# Patient Record
Sex: Male | Born: 1950
Health system: Southern US, Community
[De-identification: ages and names within clinical notes are randomized; demographics above are authoritative.]

## PROBLEM LIST (undated history)

## (undated) DIAGNOSIS — G4733 Obstructive sleep apnea (adult) (pediatric): Secondary | ICD-10-CM

## (undated) DIAGNOSIS — K219 Gastro-esophageal reflux disease without esophagitis: Secondary | ICD-10-CM

## (undated) DIAGNOSIS — J309 Allergic rhinitis, unspecified: Secondary | ICD-10-CM

## (undated) DIAGNOSIS — J45909 Unspecified asthma, uncomplicated: Secondary | ICD-10-CM

## (undated) HISTORY — PX: OTHER SURGICAL HISTORY: SHX169

## (undated) HISTORY — DX: Obstructive sleep apnea (adult) (pediatric): G47.33

## (undated) HISTORY — PX: HEMORRHOID SURGERY: SHX153

## (undated) HISTORY — PX: NASAL SEPTOPLASTY W/ TURBINOPLASTY: SHX2070

## (undated) HISTORY — DX: Allergic rhinitis, unspecified: J30.9

## (undated) HISTORY — DX: Unspecified asthma, uncomplicated: J45.909

## (undated) HISTORY — DX: Gastro-esophageal reflux disease without esophagitis: K21.9

---

## 1998-03-02 ENCOUNTER — Encounter (HOSPITAL_COMMUNITY): Admission: RE | Admit: 1998-03-02 | Discharge: 1998-05-31 | Payer: Self-pay | Admitting: *Deleted

## 1998-03-07 ENCOUNTER — Ambulatory Visit (HOSPITAL_COMMUNITY): Admission: RE | Admit: 1998-03-07 | Discharge: 1998-03-07 | Payer: Self-pay | Admitting: *Deleted

## 1998-03-10 ENCOUNTER — Encounter: Payer: Self-pay | Admitting: *Deleted

## 1998-03-10 ENCOUNTER — Ambulatory Visit (HOSPITAL_COMMUNITY): Admission: RE | Admit: 1998-03-10 | Discharge: 1998-03-10 | Payer: Self-pay | Admitting: *Deleted

## 1998-04-18 ENCOUNTER — Ambulatory Visit (HOSPITAL_BASED_OUTPATIENT_CLINIC_OR_DEPARTMENT_OTHER): Admission: RE | Admit: 1998-04-18 | Discharge: 1998-04-18 | Payer: Self-pay | Admitting: General Surgery

## 1999-04-10 ENCOUNTER — Encounter: Payer: Self-pay | Admitting: Internal Medicine

## 1999-04-10 ENCOUNTER — Ambulatory Visit: Admission: RE | Admit: 1999-04-10 | Discharge: 1999-04-10 | Payer: Self-pay | Admitting: Internal Medicine

## 2004-04-27 ENCOUNTER — Encounter (INDEPENDENT_AMBULATORY_CARE_PROVIDER_SITE_OTHER): Payer: Self-pay | Admitting: *Deleted

## 2004-04-27 ENCOUNTER — Ambulatory Visit (HOSPITAL_COMMUNITY): Admission: RE | Admit: 2004-04-27 | Discharge: 2004-04-27 | Payer: Self-pay | Admitting: *Deleted

## 2004-08-15 ENCOUNTER — Ambulatory Visit: Payer: Self-pay | Admitting: Internal Medicine

## 2005-07-05 ENCOUNTER — Ambulatory Visit: Payer: Self-pay | Admitting: Internal Medicine

## 2006-07-05 ENCOUNTER — Ambulatory Visit: Payer: Self-pay | Admitting: Internal Medicine

## 2006-12-26 ENCOUNTER — Encounter: Admission: RE | Admit: 2006-12-26 | Discharge: 2006-12-26 | Payer: Self-pay | Admitting: Internal Medicine

## 2007-07-03 DIAGNOSIS — J3089 Other allergic rhinitis: Secondary | ICD-10-CM

## 2007-07-03 DIAGNOSIS — J4520 Mild intermittent asthma, uncomplicated: Secondary | ICD-10-CM | POA: Insufficient documentation

## 2007-07-03 DIAGNOSIS — G4733 Obstructive sleep apnea (adult) (pediatric): Secondary | ICD-10-CM | POA: Insufficient documentation

## 2007-07-03 DIAGNOSIS — J302 Other seasonal allergic rhinitis: Secondary | ICD-10-CM | POA: Insufficient documentation

## 2007-07-04 ENCOUNTER — Ambulatory Visit: Payer: Self-pay | Admitting: Internal Medicine

## 2007-07-05 DIAGNOSIS — K219 Gastro-esophageal reflux disease without esophagitis: Secondary | ICD-10-CM | POA: Insufficient documentation

## 2007-07-22 ENCOUNTER — Telehealth (INDEPENDENT_AMBULATORY_CARE_PROVIDER_SITE_OTHER): Payer: Self-pay | Admitting: *Deleted

## 2007-10-03 ENCOUNTER — Ambulatory Visit: Payer: Self-pay | Admitting: Internal Medicine

## 2007-10-03 DIAGNOSIS — R232 Flushing: Secondary | ICD-10-CM | POA: Insufficient documentation

## 2007-10-06 ENCOUNTER — Telehealth: Payer: Self-pay | Admitting: Internal Medicine

## 2007-10-27 ENCOUNTER — Encounter: Payer: Self-pay | Admitting: Internal Medicine

## 2008-03-19 ENCOUNTER — Observation Stay (HOSPITAL_COMMUNITY): Admission: EM | Admit: 2008-03-19 | Discharge: 2008-03-21 | Payer: Self-pay | Admitting: Emergency Medicine

## 2008-06-28 ENCOUNTER — Ambulatory Visit: Payer: Self-pay | Admitting: Internal Medicine

## 2009-01-25 ENCOUNTER — Encounter: Payer: Self-pay | Admitting: Internal Medicine

## 2009-06-28 ENCOUNTER — Ambulatory Visit: Payer: Self-pay | Admitting: Internal Medicine

## 2010-06-14 ENCOUNTER — Encounter: Payer: Self-pay | Admitting: Internal Medicine

## 2010-06-22 NOTE — Assessment & Plan Note (Signed)
Summary: 12 MOS/VAC  Medications Added * HCTZ 25MG  Take 1 tablet by mouth once a day ZITHROMAX Z-PAK 250 MG  TABS (AZITHROMYCIN) 2 today then 1 daily      Allergies Added: NKDA  Visit Type:  Follow-up PCP:  Todd Gentry  Chief Complaint:  yearly check up.  History of Present Illness: Current Problems:  ESOPHAGEAL REFLUX (ICD-530.81) ASTHMA (ICD-493.90) ALLERGIC RHINITIS (ICD-477.9) OBSTRUCTIVE SLEEP APNEA (ICD-327.23)  Lingering nasal drainage after a cold a month ago. Not purulent now, and no fever or headache. Ears are ok. Still using CPAP 16 ok Gentiva, heated humidifier, nasal mask Not a lot of heart burn currently- did bother earlier. He is monitored for Barrett's esophagus. We discussed the concern that some of his throat sensation could be from bland reflux.      Current Allergies (reviewed today): No known allergies   Past Medical History:    Reviewed history and no changes required:       OSA       Allergic rhinitis       Asthma       G E R D       CXR nodule chronic 7-8 R intercostal space posteriorly  Past Surgical History:    Reviewed history and no changes required:       Hemorrhoidectomy       nasal septoplasty   Family History:    Reviewed history and no changes required:       Father had long hospital stay with sepsis- died,   Parkinsons       Mother has allergic rhinitis  Social History:    Reviewed history and no changes required:       Patient states former smoker.    Risk Factors:  Tobacco use:  quit   Review of Systems      See HPI   Vital Signs:  Patient Profile:   60 Years Old Male Weight:      224.38 pounds O2 Sat:      96 % O2 treatment:    Room Air Pulse rate:   83 / minute BP sitting:   122 / 88  (right arm)  Vitals Entered By: Todd Gentry (July 04, 2007 8:52 AM)             Is Patient Diabetic? No Comments Medications reviewed with  patient ..................................................................Marland KitchenDarra Lis Gentry  July 04, 2007 8:58 AM      Physical Exam  General:     well developed, well nourished, in no acute distress Head:     normocephalic and atraumatic Eyes:     PERRLA/EOM intact; conjunctiva and sclera clear Ears:     TMs intact and clear with normal canals Nose:     no deformity, discharge, inflammation, or lesions Mouth:     Melampatti Class III.   Mucosa not red or cobblestoned Neck:     no masses, thyromegaly, or abnormal cervical nodes Chest Wall:     no deformities noted Lungs:     clear bilaterally to auscultation and percussion Heart:     regular rate and rhythm, S1, S2 without murmurs, rubs, gallops, or clicks Cervical Nodes:     no significant adenopathy     Impression & Recommendations:  Problem # 1:  OBSTRUCTIVE SLEEP APNEA (ICD-327.23) Assessment: Unchanged Continue cpap16 Orders: Est. Patient Level III (57846)   Problem # 2:  ALLERGIC RHINITIS (ICD-477.9) Contolled seasonal rhinitis. Slowly resolving postviral type rhinitis should finish resolving soon. We  will send in Rx for z-pak to hold, with discussion.  Orders: Est. Patient Level III ([id number])   Medications Added to Medication List This Visit: 1)  Hctz 25mg   .... Take 1 tablet by mouth once a day 2)  Zithromax Z-pak 250 Mg Tabs (Azithromycin) .... 2 today then 1 daily   Patient Instructions: 1)  Please schedule a follow-up appointment in 1 year. 2)  Z pak script at drug store    Prescriptions: ZITHROMAX Z-PAK 250 MG  TABS (AZITHROMYCIN) 2 today then 1 daily  #1 pak x 0   Entered and Authorized by:   [name] MD   Signed by:   [name] MD on 07/05/2007   Method used:   Electronically sent to ...       [facility]  [phone]*       [address]       [place], [place]  [address]       Ph: [phone] or [phone]       Fax: [phone]   RxID:    [id number]  ]

## 2010-06-22 NOTE — Assessment & Plan Note (Signed)
Summary: 12 months/apc   Primary Provider/Referring Provider:  Tyson Dense  CC:  Yearly follow up visit-no complaints;[facility]  History of Present Illness: 07/04/07- Lingering nasal drainage after a cold a month ago. Not purulent now, and no fever or headache. Ears are ok. Still using CPAP 16 ok Gentiva, heated humidifier, nasal mask Not a lot of heart burn currently- did bother earlier. He is monitored for Barrett's esophagus. We discussed the concern that some of his throat sensation could be from bland reflux.  10/03/07 60 year old male pt that presents for 6 weeks of facial redness that is predominatley across bilateral cheeks/nose. Feels warm at times, has episodes when it get very red. Does not have a known reproducible triggers. Denies new meds, excessive sun exposure, recent antibiotic but occured before taking med, no new meds. no joint aches, dysphagia, fever, blisters or acne. Minimal etoh intake.  Wears CPAP mask at night-had mask for 8 yrs.   06/28/08- Rhinitis, OSA, asthma Flushing resolved itself last Spring. Breathing has done very well. stopped advair last year- it made him anxious, and restless. Has had almost no need for rescue inhaler. Walking has lost 20 lbs. Still wears CPAP 16 every night- good compliance and control.   June 28, 2009- Rhinitis, OSA, asthma Has regained about 6 lbs. Has rarely needed his rescue inhaler over the past year, stil using only his sample.  He continues CPAP 16 used every night with no daytime sleepiness and no problems.     Current Medications (verified): 1)  Prilosec 20 Mg  Cpdr (Omeprazole) .... Take 1 Capsule By Mouth Two Times A Day 2)  Cpap .Marland KitchenMarland Kitchen. 16 3)  Hydrochlorothiazide 25 Mg  Tabs (Hydrochlorothiazide) .... Take 1 Tablet By Mouth Once A Day 4)  Proair Hfa 108 (90 Base) Mcg/act  Aers (Albuterol Sulfate) .... 2 Puffs Four Times A Day As Needed  Allergies (verified): No Known Drug  Allergies  Past History:  Past Medical History: Last updated: 10/03/2007 G E R D (ICD-530.81) ESOPHAGEAL REFLUX (ICD-530.81) ASTHMA (ICD-493.90) ALLERGIC RHINITIS (ICD-477.9) OBSTRUCTIVE SLEEP APNEA (ICD-327.23)    Past Surgical History: Last updated: 06/28/2008 Hemorrhoidectomy nasal septoplasty limited palatoplasty/uvulectomy  Family History: Last updated: 07/04/2007 Father had long hospital stay with sepsis- died,   Parkinsons Mother has allergic rhinitis  Social History: Last updated: 06/28/2008 Patient states former smoker.  married  Risk Factors: Smoking Status: quit (07/04/2007)  Review of Systems      See HPI  The patient denies anorexia, fever, weight loss, weight gain, vision loss, decreased hearing, hoarseness, chest pain, syncope, dyspnea on exertion, peripheral edema, prolonged cough, headaches, hemoptysis, abdominal pain, and severe indigestion/heartburn.    Vital Signs:  Patient profile:   60 year old male Height:      72 inches Weight:      223.13 pounds BMI:     30.37 O2 Sat:      98 % on Room air Pulse rate:   83 / minute BP sitting:   126 / 84  (left arm) Cuff size:   regular  Vitals Entered By: Reynaldo Minium CMA (June 28, 2009 9:05 AM)  O2 Flow:  Room air  Physical Exam  Additional Exam:  General: A/Ox3; pleasant and cooperative, NAD, still overweight SKIN: no rash, lesions NODES: no lymphadenopathy HEENT: [place]/AT, EOM- WNL, Conjuctivae- clear, PERRLA, TM-WNL, Nose- clear, Throat- clear and wnl, Melampatti III with shortened uvula NECK: Supple w/ fair ROM, JVD- none, normal carotid  impulses w/o bruits Thyroid-  CHEST: Clear to P&A HEART: RRR, no m/g/r heard ABDOMEN: Soft and nl;  XBJ:YNWG, nl pulses, no edema  NEURO: Grossly intact to observation      Impression & Recommendations:  Problem # 1:  ASTHMA (ICD-493.90) He is well controlled now and rare use of a rescue inhaler has been sufficient. We will see how he does as  Spring comes in, occasionally a problem season for him.  Problem # 2:  OBSTRUCTIVE SLEEP APNEA (ICD-327.23)  Good CPAP compliance and control. I encouraged ongoing attention to his weight.  Orders: Est. Patient Level III (95621)  Patient Instructions: 1)  Schedule return in one year, earlier if needed 2)  Continue CPAP at 16 3)  Sample rescue inhaler

## 2010-06-22 NOTE — Progress Notes (Signed)
Summary: LMTCB-need order for mask  Phone Note Call from Patient Call back at [phone]   Caller: Patient Call For: young Summary of Call: need order sent to [facility] to get mask  Initial call taken by: Rickard Patience,  Oct 06, 2007 9:13 AM  Follow-up for Phone Call        Attempted to call pt back.  LMOM. Cloyde Reams RN  Oct 06, 2007 10:09 AM   Additional Follow-up for Phone Call Additional follow up Details #1::        MASK HAS TEAR IN IT AND IS JUST OLD, HAS HAD IT FOR 7-8 [facility] AND NEEDS NEW ONE   Philipp Deputy [facility]  Oct 06, 2007 11:06 AM  Additional Follow-up by: [name] MD,  Oct 06, 2007 11:47 AM    Additional Follow-up for Phone Call Additional follow up Details #2::    Called pt and he stated that he drove to [place] and they didn't have mask in store that they would mail him one from the Warehouse. Pt stated [facility] told him it would be a day or so. I advised pt that I would follow up with [facility] in the morning to check on status of order. Office closed now. Rhonda Cobb  Oct 06, 2007 5:18 PM Spoke with Christoper Allegra this afternoon. Stated that mask was not in stock, shipped from warehouse, pt shoud recvd. mask today or no later than tomorrow (10/08/07).  LMOAM for pt at work to call me if he hasn't recvd. mask by 10/08/07 and I would call [facility] back. Follow-up by: Alfonso Ramus,  Oct 07, 2007 2:54 PM

## 2010-06-22 NOTE — Consult Note (Signed)
Summary: [facility]  [facility]   Imported By: Lanelle Bal 11/05/2007 15:15:01  _____________________________________________________________________  External Attachment:    Type:   Image     Comment:   External Document

## 2010-06-22 NOTE — Assessment & Plan Note (Signed)
Summary: face red/ mbw   PCP:  Tyson Dense  Chief Complaint:  redness on face - comes and goes x2-3 weeks.  History of Present Illness: Current Problems:  ESOPHAGEAL REFLUX (ICD-530.81) ASTHMA (ICD-493.90) ALLERGIC RHINITIS (ICD-477.9) OBSTRUCTIVE SLEEP APNEA (ICD-327.23)  Still using CPAP 16 ok Gentiva, heated humidifier, nasal mask    60 year old male pt that presents for 6 weeks of facial redness that is predominatley across bilateral cheeks/nose. Feels warm at times, has episodes when it get very red. Does not have a known reproducible triggers. Denies new meds, excessive sun exposure, recent antibiotic but occured before taking med, no new meds. no joint aches, dysphagia, fever, blisters or acne. Minimal etoh intake.  Wears CPAP mask at night-had mask for 8 yrs.      Prior Medication List:  NEXIUM 40 MG  CPDR (ESOMEPRAZOLE MAGNESIUM) Take 1 tablet by mouth once a day * CPAP 16 * HCTZ 25MG  Take 1 tablet by mouth once a day ZITHROMAX Z-PAK 250 MG  TABS (AZITHROMYCIN) 2 today then 1 daily PULMICORT FLEXHALER 90 MCG/ACT  INHA (BUDESONIDE) 2 puffs and rinse, twice every day PROAIR HFA 108 (90 BASE) MCG/ACT  AERS (ALBUTEROL SULFATE) 2 puffs four times a day as needed   Updated Prior Medication List: PRILOSEC 20 MG  CPDR (OMEPRAZOLE) Take 1 capsule by mouth two times a day * CPAP 16 HYDROCHLOROTHIAZIDE 25 MG  TABS (HYDROCHLOROTHIAZIDE) Take 1 tablet by mouth once a day PROAIR HFA 108 (90 BASE) MCG/ACT  AERS (ALBUTEROL SULFATE) 2 puffs four times a day as needed  Current Allergies (reviewed today): No known allergies   Past Medical History:    Reviewed history from 07/04/2007 and no changes required:       G E R D (ICD-530.81)       ESOPHAGEAL REFLUX (ICD-530.81)       ASTHMA (ICD-493.90)       ALLERGIC RHINITIS (ICD-477.9)       OBSTRUCTIVE SLEEP APNEA (ICD-327.23)           Family History:    Reviewed history from 07/04/2007 and no changes required:       Father  had long hospital stay with sepsis- died,   Parkinsons       Mother has allergic rhinitis  Social History:    Reviewed history from 07/04/2007 and no changes required:       Patient states former smoker.    Risk Factors: Tobacco use:  quit    Year quit:  1984    Pack-years:  84yrs, 1ppd   Review of Systems      See HPI   Vital Signs:  Patient Profile:   60 Years Old Male Weight:      226.38 pounds O2 Sat:      97 % O2 treatment:    Room Air Temp:     97.5 degrees F oral Pulse rate:   79 / minute BP sitting:   116 / 64  (left arm) Cuff size:   regular  Vitals Entered By: Boone Master CNA (Oct 03, 2007 9:58 AM)             Is Patient Diabetic? No Comments Medications reviewed with patient Boone Master CNA  Oct 03, 2007 9:58 AM      Physical Exam  GENERAL:  A/Ox3; pleasant & cooperative.NAD HEENT:  [place]/AT, EOM-wnl, PERRLA, EACs-clear, TMs-wnl, NOSE-clear, THROAT-clear & wnl. NECK:  Supple w/ fair ROM; no JVD; normal carotid impulses w/o bruits; no thyromegaly  or nodules palpated; no lymphadenopathy. CHEST:  Clear to P & A; w/o, wheezes/ rales/ or rhonchi. HEART:  RRR, no m/r/g  heard ABDOMEN:  Soft & nt; nml bowel sounds; no organomegaly or masses detected. EXT: Warm bilat,  no calf pain, edema, clubbing, pulses intact Skin: facial with reddened skin along cheeks, no vesicles/papules noted, no abnormal blood vessels.      Impression & Recommendations:  Problem # 1:  FACIAL FLUSHING (ICD-782.62) Questionable etiology. Refer to Dermatology.  May need labs ie ANA, ESR defer to Derm.  Orders: Dermatology Referral ([facility]) Est. Patient Level III (78295)   Medications Added to Medication List This Visit: 1)  Prilosec 20 Mg Cpdr (Omeprazole) .... Take 1 capsule by mouth two times a day 2)  Hydrochlorothiazide 25 Mg Tabs (Hydrochlorothiazide) .... Take 1 tablet by mouth once a day   Patient Instructions: 1)  follow up dermatology 2)  avoid sun exposure 3)   sun screen  4)  Please contact office for sooner follow up if symptoms do not improve or worsen    ]

## 2010-06-22 NOTE — Progress Notes (Signed)
Summary: rx sub  Phone Note Call from Patient   Caller: Patient Call For: young Summary of Call: pt has changed ins. co. advair is expensive. can we substitute? if not he will stick w/ this. please advise. [phone]. Patient's chart has been requested. Initial call taken by: Tivis Ringer,  July 22, 2007 11:14 AM  Follow-up for Phone Call        We can change to a maintenance steroid inhaler - Pulmicort, 2 puffs and rinse twice every day, with a rescue albuterol inhlaer- Proair. I've put these on the med list. Please call him to come by for sample of the Pulmicort and instruction in that device. Follow-up by: [name] MD,  July 22, 2007 12:14 PM  Additional Follow-up for Phone Call Additional follow up Details #1::        LMOM TCB  Additional Follow-up by: Cyndia Diver LPN,  July 22, 2007 12:20 PM    Additional Follow-up for Phone Call Additional follow up Details #2::    Called and spoke with wife - will leave sample of Pulmicort and ProAir up front - Pt is to ask for nurse to show how to use Pulmicort. Also reminded wife that since this is a steroid, make sure pt rinses mouth well. She took notes and is aware. Follow-up by: Marinus Maw,  July 22, 2007 5:38 PM  New/Updated Medications: PULMICORT FLEXHALER 90 MCG/ACT  INHA (BUDESONIDE) 2 puffs and rinse, twice every day PROAIR HFA 108 (90 BASE) MCG/ACT  AERS (ALBUTEROL SULFATE) 2 puffs four times a day as needed   Prescriptions: PROAIR HFA 108 (90 BASE) MCG/ACT  AERS (ALBUTEROL SULFATE) 2 puffs four times a day as needed  #1 x prn   Entered by:   [name] MD   Authorized by:   Pulmonary Triage   Signed by:   Marinus Maw on 07/22/2007   Method used:   Historical   RxID:   [id number] PULMICORT FLEXHALER 90 MCG/ACT  INHA (BUDESONIDE) 2 puffs and rinse, twice every day  #1 x prn   Entered by:   [name] MD   Authorized by:   Pulmonary Triage   Signed by:   Marinus Maw on  07/22/2007   Method used:   Historical   RxID:   [id number]

## 2010-06-22 NOTE — Assessment & Plan Note (Signed)
Summary: 1 year return from checkout 07/04/07/mh   PCP:  Tyson Dense  Chief Complaint:  yearly follow up -allergies and OSA; "doing fine".  History of Present Illness: Current Problems:  FACIAL FLUSHING (ICD-782.62) G E R D (ICD-530.81) ESOPHAGEAL REFLUX (ICD-530.81) ASTHMA (ICD-493.90) ALLERGIC RHINITIS (ICD-477.9) OBSTRUCTIVE SLEEP APNEA (ICD-327.23)  07/04/07- Lingering nasal drainage after a cold a month ago. Not purulent now, and no fever or headache. Ears are ok. Still using CPAP 16 ok Gentiva, heated humidifier, nasal mask Not a lot of heart burn currently- did bother earlier. He is monitored for Barrett's esophagus. We discussed the concern that some of his throat sensation could be from bland reflux.  10/03/07 60 year old male pt that presents for 6 weeks of facial redness that is predominatley across bilateral cheeks/nose. Feels warm at times, has episodes when it get very red. Does not have a known reproducible triggers. Denies new meds, excessive sun exposure, recent antibiotic but occured before taking med, no new meds. no joint aches, dysphagia, fever, blisters or acne. Minimal etoh intake.  Wears CPAP mask at night-had mask for 8 yrs.   06/28/08- Rhinitis, OSA, asthma Flushing resolved itself last Spring. Breathing has done very well. stopped advair last year- it made him anxious, and restless. Has had almost no need for rescue inhaler. Walking has lost 20 lbs. Still wears CPAP 16 every night- good compliance and control.        Prior Medications Reviewed Using: Patient Recall  Prior Medication List:  PRILOSEC 20 MG  CPDR (OMEPRAZOLE) Take 1 capsule by mouth two times a day * CPAP 16 HYDROCHLOROTHIAZIDE 25 MG  TABS (HYDROCHLOROTHIAZIDE) Take 1 tablet by mouth once a day PROAIR HFA 108 (90 BASE) MCG/ACT  AERS (ALBUTEROL SULFATE) 2 puffs four times a day as needed   Updated Prior Medication List: PRILOSEC 20 MG  CPDR (OMEPRAZOLE) Take 1 capsule by mouth two times  a day * CPAP 16 HYDROCHLOROTHIAZIDE 25 MG  TABS (HYDROCHLOROTHIAZIDE) Take 1 tablet by mouth once a day PROAIR HFA 108 (90 BASE) MCG/ACT  AERS (ALBUTEROL SULFATE) 2 puffs four times a day as needed  Current Allergies (reviewed today): No known allergies  Past Medical History:    Reviewed history from 10/03/2007 and no changes required:       G E R D (ICD-530.81)       ESOPHAGEAL REFLUX (ICD-530.81)       ASTHMA (ICD-493.90)       ALLERGIC RHINITIS (ICD-477.9)       OBSTRUCTIVE SLEEP APNEA (ICD-327.23)          Past Surgical History:    Reviewed history from 07/04/2007 and no changes required:       Hemorrhoidectomy       nasal septoplasty       limited palatoplasty/uvulectomy  Family History:    Reviewed history from 07/04/2007 and no changes required:       Father had long hospital stay with sepsis- died,   Parkinsons       Mother has allergic rhinitis  Social History:    Reviewed history from 07/04/2007 and no changes required:       Patient states former smoker.        married   Review of Systems      See HPI       Denies headache, sinus drainage, sneezing chest pain, dyspnea, n/v/d, weight loss, fever, edema.    Vital Signs:  Patient Profile:   60 Years Old Male  Weight:      217.25 pounds O2 Sat:      96 % O2 treatment:    Room Air Pulse rate:   93 / minute BP sitting:   124 / 82  (left arm) Cuff size:   regular  Vitals Entered By: Reynaldo Minium CMA (June 28, 2008 9:02 AM)             Comments Medications reviewed with patient Reynaldo Minium CMA  June 28, 2008 9:02 AM     Physical Exam  General: A/Ox3; pleasant and cooperative, NAD, still overweight SKIN: no rash, lesions NODES: no lymphadenopathy HEENT: [place]/AT, EOM- WNL, Conjuctivae- clear, PERRLA, TM-WNL, Nose- clear, Throat- clear and wnl, Melampatti III with shortened uvula NECK: Supple w/ fair ROM, JVD- none, normal carotid impulses w/o bruits Thyroid- normal to palpation CHEST: Clear to  P&A HEART: RRR, no m/g/r heard ABDOMEN: Soft and nl; nml bowel sounds; no organomegaly or masses noted UEA:VWUJ, nl pulses, no edema  NEURO: Grossly intact to observation      Problem # 1:  ASTHMA (ICD-493.90) Almost inactive. Will keep a rescue inhaler on his med list His updated medication list for this problem includes:    Proair Hfa 108 (90 Base) Mcg/act Aers (Albuterol sulfate) .Marland Kitchen... 2 puffs four times a day as needed   Problem # 2:  OBSTRUCTIVE SLEEP APNEA (ICD-327.23) We discussed adjustment of cpap if appropriate as he loses weight. For now he is comfortable and we will not change settings.  Patient Instructions: 1)  Please schedule a follow-up appointment in 1 year. 2)  sample script for Proair inhaler- 2 puffs up to 4x daily if needed as a "rescue" inhaler. Prescriptions: PROAIR HFA 108 (90 BASE) MCG/ACT  AERS (ALBUTEROL SULFATE) 2 puffs four times a day as needed  #1 x prn   Entered and Authorized by:   [name] MD   Signed by:   [name] MD on 06/28/2008   Method used:   Print then Give to Patient   RxID:   [id number]

## 2010-06-27 ENCOUNTER — Ambulatory Visit (INDEPENDENT_AMBULATORY_CARE_PROVIDER_SITE_OTHER): Payer: 59 | Admitting: Internal Medicine

## 2010-06-27 ENCOUNTER — Encounter: Payer: Self-pay | Admitting: Internal Medicine

## 2010-06-27 DIAGNOSIS — G4733 Obstructive sleep apnea (adult) (pediatric): Secondary | ICD-10-CM

## 2010-06-27 DIAGNOSIS — J45909 Unspecified asthma, uncomplicated: Secondary | ICD-10-CM

## 2010-06-27 DIAGNOSIS — J309 Allergic rhinitis, unspecified: Secondary | ICD-10-CM

## 2010-06-27 LAB — CONVERTED CEMR LAB
Cholesterol, target level: 200 mg/dL
HDL goal, serum: 40 mg/dL
LDL Goal: 160 mg/dL

## 2010-06-30 ENCOUNTER — Encounter: Payer: Self-pay | Admitting: Internal Medicine

## 2010-07-06 NOTE — Letter (Signed)
Summary: CMN for CPAP Supplies/[facility]  CMN for CPAP Supplies/[facility]   Imported By: Sherian Rein 06/27/2010 09:24:59  _____________________________________________________________________  External Attachment:    Type:   Image     Comment:   External Document

## 2010-07-06 NOTE — Assessment & Plan Note (Signed)
Summary: 1 yr rov//kp   Primary Provider/Referring Provider:  Tyson Dense  CC:  Yearly follow up visit-OSA and asthma. Usual SOB but nothing out of normal; wearing CPAP each night(recently got new machine). and Lipid Management.  History of Present Illness:  06/28/08- Rhinitis, OSA, asthma [place] resolved itself last Spring. Breathing has done very well. stopped advair last year- it made him anxious, and restless. Has had almost no need for rescue inhaler. Walking has lost 20 lbs. Still wears CPAP 16 every night- good compliance and control.   June 28, 2009- Rhinitis, OSA, asthma Has regained about 6 lbs. Has rarely needed his rescue inhaler over the past year, stil using only his sample.  He continues CPAP 16 used every night with no daytime sleepiness and no problems.  June 27, 2010-  Rhinitis, OSA, asthma Nurse-CC: Yearly follow up visit-OSA and asthma. Usual SOB but nothing out of normal; wearing CPAP each night (recently got new machine). He has new CPAP mask and machine he is getting used to , but fully compliant with. Uses effectively every night. Nasal mask.  Rhinitis- no longer an issue- not bothered. Asthma- minimal intermittent. Triggers include stong odors [place] seem to bother him. He again has not quite used up the rescue inhaler sample we gave him last year.  No medical events over past year otherwise.       Lipid Management History:      Positive NCEP/ATP III risk factors include male age 73 years old or older.  Negative NCEP/ATP III risk factors include non-tobacco-user status.    Preventive Screening-Counseling & Management  Alcohol-Tobacco     Smoking Status: quit     Year Quit: 1984     Pack years: 45yrs, 1ppd  Current Medications (verified): 1)  Prilosec 20 Mg  Cpdr (Omeprazole) .... Take 1 Capsule By Mouth Once Daily 2)  Cpap .Marland KitchenMarland Kitchen. 16 3)  Hydrochlorothiazide 25 Mg  Tabs (Hydrochlorothiazide) .... Take 1 Tablet By Mouth Once A  Day 4)  Proair Hfa 108 (90 Base) Mcg/act  Aers (Albuterol Sulfate) .... 2 Puffs Four Times A Day As Needed  Allergies (verified): No Known Drug Allergies  Past History:  Past Surgical History: Last updated: 06/28/2008 Hemorrhoidectomy nasal septoplasty limited palatoplasty/uvulectomy  Family History: Last updated: 07/04/2007 Father had long hospital stay with sepsis- died,   Parkinsons Mother has allergic rhinitis  Social History: Last updated: 06/28/2008 Patient states former smoker.  married  Risk Factors: Smoking Status: quit (06/27/2010)  Past Medical History: G E R D (ICD-530.81) ASTHMA (ICD-493.90) ALLERGIC RHINITIS (ICD-477.9) OBSTRUCTIVE SLEEP APNEA (ICD-327.23)    Review of Systems      See HPI  The patient denies anorexia, fever, weight loss, weight gain, vision loss, decreased hearing, hoarseness, chest pain, syncope, dyspnea on exertion, peripheral edema, prolonged cough, headaches, hemoptysis, abdominal pain, and severe indigestion/heartburn.    Vital Signs:  Patient profile:   60 year old male Height:      72 inches Weight:      224 pounds BMI:     30.49 O2 Sat:      100 % on Room air Pulse rate:   75 / minute BP sitting:   122 / 80  (left arm) Cuff size:   regular  Vitals Entered By: Reynaldo Minium CMA (June 27, 2010 9:07 AM)  O2 Flow:  Room air CC: Yearly follow up visit-OSA and asthma. Usual SOB but nothing out of normal; wearing CPAP each night(recently got  new machine)., Lipid Management   Physical Exam  Additional Exam:  General: A/Ox3; pleasant and cooperative, NAD, still overweight SKIN: no rash, lesions NODES: no lymphadenopathy HEENT: [place]/AT, EOM- WNL, Conjuctivae- clear, PERRLA, TM-WNL, Nose- clear, Throat- clear and wnl, Mallampati III with shortened uvula s/p UPPP NECK: Supple w/ fair ROM, JVD- none, normal carotid impulses w/o bruits Thyroid-  CHEST: Clear to P&A HEART: RRR, no m/g/r heard ABDOMEN: Soft and nl;  ZOX:WRUE,  nl pulses, no edema  NEURO: Grossly intact to observation      Impression & Recommendations:  Problem # 1:  OBSTRUCTIVE SLEEP APNEA (ICD-327.23)  Great compliance, control and satisfaction Original therapeutic trial with UPPP had not worked for him.   Problem # 2:  ALLERGIC RHINITIS (ICD-477.9)  Minor issue with year-to-year variablity  Problem # 3:  ASTHMA (ICD-493.90) [place] intermittent asthma, with sufficient control from rescue inhaler.   Medications Added to Medication List This Visit: 1)  Prilosec 20 Mg Cpdr (Omeprazole) .... Take 1 capsule by mouth once daily 2)  Cpap 16 [facility]   Other Orders: Est. Patient Level IV (45409)  Lipid Assessment/Plan:      Based on NCEP/ATP III, the patient's risk factor category is "0-1 risk factors".  The patient's lipid goals are as follows: Total cholesterol goal is 200; LDL cholesterol goal is 160; HDL cholesterol goal is 40; Triglyceride goal is 150.     Patient Instructions: 1)  Please schedule a follow-up appointment in 1 year. Please call sooner as needed.  2)  Sample Proair or equivalent  3)  Continue CPAP at 16 Prescriptions: PROAIR HFA 108 (90 BASE) MCG/ACT  AERS (ALBUTEROL SULFATE) 2 puffs four times a day as needed  #1 x prn   Entered and Authorized by:   [name] MD   Signed by:   [name] MD on 06/27/2010   Method used:   Print then Give to Patient   RxID:   [id number]

## 2010-10-03 NOTE — Discharge Summary (Signed)
NAMEPERRY, Todd Gentry             ACCOUNT NO.:  [id number]   MEDICAL RECORD NO.:  [id number]          PATIENT TYPE:  OBV   LOCATION:  1415                         FACILITY:  [facility]   PHYSICIAN:  Theressa Millard, M.D.    DATE OF BIRTH:  [birth year]   DATE OF ADMISSION:  03/19/2008  DATE OF DISCHARGE:  03/21/2008                               DISCHARGE SUMMARY   DISCHARGE DIAGNOSES:  1. Vasovagal episode.  2. Well-controlled diabetes.  3. Hypertension.  4. Gastroesophageal reflux disease.   The patient is a 60 year old white male who has generally been healthy  with a history of hypertension who came in with lightheadedness and  nausea, and some feeling of chest discomfort.   HOSPITAL COURSE:  The patient is evaluated in the emergency department  and his EKG was normal except for the possible prolonged QT.  White  count was elevated and initial CK was slightly elevated although MB was  normal.  It was felt that he should come in for further evaluation.  In  the hospital, I obtained further history that he had felt poorly for  several weeks but was able to play golf and do his other activities.  However, on the day of admission, he was loading the car and developed  profound sweating to the point where he drenched his clothes, was  nauseated, and became lightheaded.  He was so lightheaded he had to lay  down on the floor of the bathroom and remained there for approximately a  hour before he was found by his daughter and EMS was called.  Evaluation  revealed an elevated white count as stated above, but all other tests  subsequently were normal.  He had some lab work on for an insurance  physical on October 21 that showed elevation in ALT, but that had  normalized by the time of this admission.  His A1c was 6.7.  His  strength improved while he was in the hospital.  He was able to ambulate  without problem.  No specific etiology for what I think was a vasovagal  episode was forthcoming.   He was discharged in improved condition.  His  white count normalized and all other tests were negative.   DISCHARGE MEDICATIONS:  1. Hydrochlorothiazide 25 mg daily.  2. Prilosec 20 mg b.i.d.  3. Albuterol inhaler p.r.n.Marland Kitchen   ACTIVITIES:  Increase work activity slowly over the next few days.   DIET:  Modified carbohydrate.  No added salt.   FOLLOWUP:  Dr. Venita Sheffield office will call him to make a followup  appointment.     Theressa Millard, M.D.  Electronically Signed    JO/MEDQ  D:  03/21/2008  T:  03/21/2008  Job:  [id number]

## 2010-10-03 NOTE — H&P (Signed)
NAMEGAUDENCIO, CHESNUT             ACCOUNT NO.:  [id number]   MEDICAL RECORD NO.:  [id number]          PATIENT TYPE:  EMS   LOCATION:  ED                           [facility]ACILITY[facility]  [facility]   PHYSICIAN:  Hollice Espy, M.D.DATE OF BIRTH:  1950-10-22   DATE OF ADMISSION:  03/19/2008  DATE OF DISCHARGE:                              HISTORY & PHYSICAL   PRIMARY CARE PHYSICIAN:  Dr. Georgann Housekeeper.   CHIEF COMPLAINT:  Chest discomfort and nausea.   HISTORY OF PRESENT ILLNESS:  The patient is a 60 year old white male  with a past medical history of hypertension, who has been in previously  good health and then earlier on today just started feeling very weak and  lightheaded to the point where he could not stand and was throwing up.  He did not really have any abdominal pain, but he was complaining of  some chest discomfort.  He did not really have any associated shortness  of breath.  With these symptoms, he decided to come into the emergency  room.  In the emergency room, an EKG was done which showed a normal  sinus rhythm with a questionable prolonged QT of about 390.  A chest x-  ray was done which was unremarkable.  Labs were done.  He had a normal  set of cardiac markers.  However, his white count was noted to be 16.7.  His heart rate was in the high end of normal at 97.  The rest of his  labs were unremarkable, except for a CPK of 204.  The patient was then  felt to come in for further evaluation and treatment.  Currently, he is  doing well.  He denies any headaches, vision changes, dysphagia, no  chest pain, palpitations, shortness of breath, wheeze, cough, no  abdominal pain, hematuria, dysuria, constipation, diarrhea, focal  extremity numbness, weakness or pain.   REVIEW OF SYSTEMS:  Otherwise negative.   PAST MEDICAL HISTORY:  1. History of glucose intolerance.  2. Hypertension.   MEDICATIONS:  1. Hydrochlorothiazide 25.  2. He is not on any diabetic medications yet.   ALLERGIES:  NO KNOWN DRUG ALLERGIES.   SOCIAL HISTORY:  He denies any tobacco, alcohol or drug use.   FAMILY HISTORY:  Positive for CAD.   PHYSICAL EXAMINATION:  VITAL SIGNS:  On admission, temperature 96.1,  heart rate 97, blood pressure 137/85, respirations 18.  O2 sat 97% on  room air.  GENERAL:  He is alert and oriented x3 in no apparent distress.  HEENT:  Normocephalic, atraumatic.  His mucous membranes are moist.  NECK:  He has no carotid bruits.  HEART:  Regular rate and rhythm.  S1-S2.  LUNGS:  Clear to auscultation bilaterally.  ABDOMEN:  Soft, distended, nontender, but only scant bowel sounds.  EXTREMITIES:  No clubbing, cyanosis or edema.   LABORATORY DATA:  White count 16.7, hemoglobin and hematocrit 15 and 45,  MCV 88, platelet count 216.  Sodium 141, potassium 3.4, chloride 103,  BUN 17, creatinine 1.2, glucose 180.  The patient did not have LFTs  done.  CPK 204, MB 3.4.  Troponin-I less than 0.05.  I have ordered a  urinalysis, culture and sensitivities, all of which is pending.   ASSESSMENT/PLAN:  1. Chest pain.  Will check two more sets of cardiac markers and place      on telemetry.  2. Leukocytosis, question if this is abdominal source or other.  Will      check the urine.  His chest x-ray is unremarkable.  3. Abdominal distention.  Scant bowel sounds.  Will check an abdominal      x-ray.  4. Glucose intolerance.  Will check a hemoglobin A1c.  5. Hypertension.  Hold hydrochlorothiazide for now.      Hollice Espy, M.D.  Electronically Signed     SKK/MEDQ  D:  03/19/2008  T:  03/19/2008  Job:  [id number]   cc:   Georgann Housekeeper, MD  Fax: [phone]

## 2010-10-06 NOTE — Assessment & Plan Note (Signed)
[facility]                             PULMONARY OFFICE NOTE   MARQUEL, POTTENGER                    MRN:          [id number]  DATE:07/05/2006                            DOB:          09-26-1950    PROBLEMS:  1. Obstructive sleep apnea.  2. Allergic rhinitis.  3. Asthma.  4. Esophageal reflux.   HISTORY:  One year follow up. He feels he has done well and is just back  from a trip to [place]. He never feels heartburn. He has been  comfortable with CPAP at 16 CWP. He does not use the humidifier, just  saline nasal spray. No problems and he feels that he is resting well.   MEDICATIONS:  1. CPAP 16 CWP.  2. Advair 100/50.  3. Nexium.  4. Rescue albuterol inhaler used p.r.n.   ALLERGIES:  No medication allergy.   OBJECTIVE:  His weight is 225 pounds, blood pressure is 142/78, pulse is  83, and room air saturation is at 97%. He seems alert and comfortable.  There are no pressure marks on his face from his mask. His nose is not  congested. There is no tremor or jiggle in the legs. He seems alert.   IMPRESSION:  1. Stable sleep apnea and asthma. There are no active problems      currently. We reviewed treatments, available options, and      medications. Schedule return in one year, earlier p.r.n.     Clinton D. Maple Hudson, MD, Tonny Bollman, FACP  Electronically Signed    CDY/MedQ  DD: 07/05/2006  DT: 07/06/2006  Job #: [id number]   cc:   Georgann Housekeeper, MD

## 2010-10-06 NOTE — Op Note (Signed)
Todd Gentry, Todd Gentry             ACCOUNT NO.:  [id number]   MEDICAL RECORD NO.:  [id number]          PATIENT TYPE:  AMB   LOCATION:  ENDO                         FACILITY:  [facility]   PHYSICIAN:  Georgiana Spinner, M.D.    DATE OF BIRTH:  1951-01-01   DATE OF PROCEDURE:  04/27/2004  DATE OF DISCHARGE:                                 OPERATIVE REPORT   PROCEDURE PERFORMED:  Upper endoscopy with biopsy.   ENDOSCOPIST:  Georgiana Spinner, M.D.   INDICATIONS FOR PROCEDURE:  Previous history of Barrett's esophagus.   ANESTHESIA:  Demerol 50 mg, Versed 5 mg.   DESCRIPTION OF PROCEDURE:  With the patient mildly sedated in the left  lateral decubitus position, the Olympus videoscopic endoscope was inserted  in the mouth and passed under direct vision through the esophagus which  appeared normal until we reached the distal esophagus and there appeared to  be some finger projections of what appeared to be Barrett's esophagus  photographed and biopsies taken.  We entered into the stomach.  The fundus,  body, antrum, duodenal bulb and second portion of the duodenum appeared  normal.  From this point, the endoscope was slowly withdrawn taking  circumferential views of the duodenal mucosa until the endoscope was pulled  back into the stomach and placed on retroflexion to view the stomach from  below.  The endoscope was then straightened and withdrawn taking  circumferential views of the remaining gastric and esophageal mucosa.  The  patient's vital signs and pulse oximeter remained stable.  The patient  tolerated the procedure well without apparent complications.   FINDINGS:  Barrett's esophagus endoscopically which had previously been  biopsy proven.  Await biopsy report.  The patient will call me for results  and follow up with me as an outpatient.       GMO/MEDQ  D:  04/27/2004  T:  04/27/2004  Job:  [id number]   cc:   Georgann Housekeeper, MD  [address]  [place]  [place] [address]  Fax: [phone]

## 2011-02-20 LAB — CBC
HCT: 42.9
HCT: 44.7
Hemoglobin: 14.4
Hemoglobin: 15.2
MCHC: 33.7
MCHC: 34
MCV: 87.7
MCV: 88.1
Platelets: 216
Platelets: 219
RBC: 4.87
RBC: 5.09
RDW: 12.9
RDW: 13
WBC: 16.7 — ABNORMAL HIGH
WBC: 9

## 2011-02-20 LAB — URINALYSIS, ROUTINE W REFLEX MICROSCOPIC
Bilirubin Urine: NEGATIVE
Glucose, UA: NEGATIVE
Hgb urine dipstick: NEGATIVE
Ketones, ur: NEGATIVE
Nitrite: NEGATIVE
Protein, ur: NEGATIVE
Specific Gravity, Urine: 1.013
Urobilinogen, UA: 0.2
pH: 6

## 2011-02-20 LAB — POCT I-STAT, CHEM 8
BUN: 17
Calcium, Ion: 1.13
Chloride: 103
Creatinine, Ser: 1.2
Glucose, Bld: 180 — ABNORMAL HIGH
HCT: 46
Hemoglobin: 15.6
Potassium: 3.4 — ABNORMAL LOW
Sodium: 141
TCO2: 25

## 2011-02-20 LAB — URINE CULTURE
Colony Count: NO GROWTH
Culture: NO GROWTH
Special Requests: NEGATIVE

## 2011-02-20 LAB — FOLATE: Folate: >20

## 2011-02-20 LAB — CARDIAC PANEL(CRET KIN+CKTOT+MB+TROPI)
CK, MB: 5.1 — ABNORMAL HIGH
CK, MB: 7 — ABNORMAL HIGH
Relative Index: 3.6 — ABNORMAL HIGH
Relative Index: 4.4 — ABNORMAL HIGH
Total CK: 143
Total CK: 158
Troponin I: 0.02

## 2011-02-20 LAB — POCT CARDIAC MARKERS
CKMB, poc: 3.4
Myoglobin, poc: 204
Troponin i, poc: <0.05

## 2011-02-20 LAB — COMPREHENSIVE METABOLIC PANEL
ALT: 48
AST: 24
Albumin: 4.3
Alkaline Phosphatase: 98
BUN: 14
CO2: 29
Calcium: 9.4
Chloride: 103
Creatinine, Ser: 1.04
GFR calc Af Amer: >60
GFR calc non Af Amer: >60
Glucose, Bld: 143 — ABNORMAL HIGH
Potassium: 3.5
Sodium: 139
Total Bilirubin: 0.8
Total Protein: 7.4

## 2011-02-20 LAB — RETICULOCYTES
RBC.: 4.92
Retic Count, Absolute: 54.1
Retic Ct Pct: 1.1

## 2011-02-20 LAB — IRON AND TIBC
Iron: 88
Saturation Ratios: 25
TIBC: 359
UIBC: 271

## 2011-02-20 LAB — FERRITIN: Ferritin: 139 (ref 22–322)

## 2011-02-20 LAB — VITAMIN B12: Vitamin B-12: 457 (ref 211–911)

## 2011-02-20 LAB — LIPASE, BLOOD: Lipase: 20

## 2011-02-20 LAB — HEMOGLOBIN A1C
Hgb A1c MFr Bld: 6.7 — ABNORMAL HIGH
Mean Plasma Glucose: 146

## 2011-06-12 ENCOUNTER — Other Ambulatory Visit: Payer: Self-pay | Admitting: Gastroenterology

## 2011-06-27 ENCOUNTER — Encounter: Payer: Self-pay | Admitting: Internal Medicine

## 2011-06-28 ENCOUNTER — Ambulatory Visit (INDEPENDENT_AMBULATORY_CARE_PROVIDER_SITE_OTHER): Payer: 59 | Admitting: Internal Medicine

## 2011-06-28 ENCOUNTER — Encounter: Payer: Self-pay | Admitting: Internal Medicine

## 2011-06-28 VITALS — BP 124/82 | HR 68 | Ht 72.0 in | Wt 217.6 lb

## 2011-06-28 DIAGNOSIS — J45909 Unspecified asthma, uncomplicated: Secondary | ICD-10-CM

## 2011-06-28 DIAGNOSIS — J309 Allergic rhinitis, unspecified: Secondary | ICD-10-CM

## 2011-06-28 DIAGNOSIS — K219 Gastro-esophageal reflux disease without esophagitis: Secondary | ICD-10-CM

## 2011-06-28 DIAGNOSIS — G4733 Obstructive sleep apnea (adult) (pediatric): Secondary | ICD-10-CM

## 2011-06-28 MED ORDER — ALBUTEROL SULFATE HFA 108 (90 BASE) MCG/ACT IN AERS: 2.0000 | INHALATION_SPRAY | Freq: Four times a day (QID) | RESPIRATORY_TRACT | Status: DC | PRN

## 2011-06-28 NOTE — Patient Instructions (Signed)
Continue CPAP 16/ [facility]- please call as needed  Refill script for albuterol HFA inhaler

## 2011-06-28 NOTE — Assessment & Plan Note (Signed)
Discussed indications for rescue inhaler. He has mild intermittent asthma and does not need maintenance controller therapy.

## 2011-06-28 NOTE — Assessment & Plan Note (Signed)
He is not feeling GERD symptoms. Well controlled.

## 2011-06-28 NOTE — Progress Notes (Signed)
06/28/11- [name] former smoker followed for OSA, allergic rhinitis, asthma, complicated by GERD LOV- 06/27/10 Wearing CPAP comfortably all night, every night- 16/[facility], nasal mask. He had remote nasal septoplasty with a limited UPPP that just shortened the uvula. No nasal complaints now, ahead of pollen season.  Rare chest tightness with almost no wheeze. He still carries a rescue inhaler sample given last year.  ROS-see HPI Constitutional:   No-   weight loss, night sweats, fevers, chills, fatigue, lassitude. HEENT:   No-  headaches, difficulty swallowing, tooth/dental problems, sore throat,       No-  sneezing, itching, ear ache, nasal congestion, post nasal drip,  CV:  No-   chest pain, orthopnea, PND, swelling in lower extremities, anasarca, dizziness, palpitations Resp: No-   shortness of breath with exertion or at rest.              No-   productive cough,  No non-productive cough,  No- coughing up of blood.              No-   change in color of mucus.  No- wheezing.   Skin: No-   rash or lesions. GI:  No-   heartburn, indigestion, abdominal pain, nausea, vomiting, diarrhea,                 change in bowel habits, loss of appetite GU: No-   Dysuria, MS:  Left shoulder pains with posterior extension.  No- decreased range of motion.  No- back pain. Neuro-     nothing unusual Psych:  No- change in mood or affect. No depression or anxiety.  No memory loss.  OBJ- Physical Exam General- Alert, Oriented, Affect-appropriate, Distress- none acute, overweight Skin- rash-none, lesions- none, excoriation- none Lymphadenopathy- none Head- atraumatic            Eyes- Gross vision intact, PERRLA, conjunctivae and secretions clear            Ears- Hearing, canals-normal            Nose- Clear- mild crusting mucus, no-Septal dev, mucus, polyps, erosion, perforation             Throat- Mallampati II- short uvula , mucosa clear , drainage- none, tonsils- atrophic Neck- flexible , trachea midline, no  stridor , thyroid nl, carotid no bruit Chest - symmetrical excursion , unlabored           Heart/CV- RRR , no murmur , no gallop  , no rub, nl s1 s2                           - JVD- none , edema- none, stasis changes- none, varices- none           Lung- clear to P&A, wheeze- none, cough- none , dullness-none, rub- none           Chest wall-  Abd- Br/ Gen/ Rectal- Not done, not indicated Extrem- cyanosis- none, clubbing, none, atrophy- none, strength- nl Neuro- grossly intact to observation

## 2011-06-28 NOTE — Assessment & Plan Note (Signed)
Well controlled in advance of Spring pollen season.

## 2011-06-28 NOTE — Assessment & Plan Note (Signed)
Good compliance and control with no changes or concerns. Need to watch out for weight gain.

## 2012-06-26 ENCOUNTER — Ambulatory Visit (INDEPENDENT_AMBULATORY_CARE_PROVIDER_SITE_OTHER): Payer: 59 | Admitting: Internal Medicine

## 2012-06-26 ENCOUNTER — Encounter: Payer: Self-pay | Admitting: Internal Medicine

## 2012-06-26 VITALS — BP 112/60 | HR 72 | Ht 72.0 in | Wt 219.6 lb

## 2012-06-26 DIAGNOSIS — G4733 Obstructive sleep apnea (adult) (pediatric): Secondary | ICD-10-CM

## 2012-06-26 NOTE — Assessment & Plan Note (Signed)
Good compliance and control. Appropriate to learn about oral appliances with plane travel in mind. He can try a boil and bite device first. He is given names for more formal evaluation if he wishes.

## 2012-06-26 NOTE — Patient Instructions (Addendum)
We can continue CPAP 16/ Todd Gentry  It may be appropriate to look into oral appliances for sleep apnea as an alternative. Dr Georgette Dover, DDS in [place] and Dr Althea Grimmer, DDS in [place] are orthodontists with a lot of experience with these.  You can also try an otc "boil and bite" mouth guard  Please call as needed

## 2012-06-26 NOTE — Progress Notes (Signed)
06/28/11- [name] former smoker followed for OSA, allergic rhinitis, asthma, complicated by GERD LOV- 06/27/10 Wearing CPAP comfortably all night, every night- 16/[facility], nasal mask. He had remote nasal septoplasty with a limited UPPP that just shortened the uvula. No nasal complaints now, ahead of pollen season.  Rare chest tightness with almost no wheeze. He still carries a rescue inhaler sample given last year.  06/26/12- [name] former smoker followed for OSA/ UPPP/CPAP, allergic rhinitis, asthma, complicated by GERD FOLLOWS FOR: wears CPAP 16/ [facility] every night for about 6 hours at least; recently got new mask. Very please with CPAP. No new problems. Planning long flight to [place] in August. Asks about oral appliance for use on plane.  Discussed oral appliances and travel-size CPAP devices for comparison.  ROS-see HPI Constitutional:   No-   weight loss, night sweats, fevers, chills, fatigue, lassitude. HEENT:   No-  headaches, difficulty swallowing, tooth/dental problems, sore throat,       No-  sneezing, itching, ear ache, nasal congestion, post nasal drip,  CV:  No-   chest pain, orthopnea, PND, swelling in lower extremities, anasarca, dizziness, palpitations Resp: No-   shortness of breath with exertion or at rest.              No-   productive cough,  No non-productive cough,  No- coughing up of blood.              No-   change in color of mucus.  No- wheezing.   Skin: No-   rash or lesions. GI:  No-   heartburn, indigestion, abdominal pain, nausea, vomiting,  GU: No-   Dysuria, MS:  Left shoulder pains with posterior extension.  . Neuro-     nothing unusual Psych:  No- change in mood or affect. No depression or anxiety.  No memory loss.  OBJ- Physical Exam General- Alert, Oriented, Affect-appropriate, Distress- none acute,  Skin- rash-none, lesions- none, excoriation- none Lymphadenopathy- none Head- atraumatic            Eyes- Gross vision intact, PERRLA, conjunctivae and  secretions clear            Ears- Hearing, canals-normal            Nose- Clear- mild crusting mucus, no-Septal dev, mucus, polyps, erosion, perforation             Throat- Mallampati III- short uvula , mucosa clear , drainage- none, tonsils- atrophic Neck- flexible , trachea midline, no stridor , thyroid nl, carotid no bruit Chest - symmetrical excursion , unlabored           Heart/CV- RRR , no murmur , no gallop  , no rub, nl s1 s2                           - JVD- none , edema- none, stasis changes- none, varices- none           Lung- clear to P&A, wheeze- none, cough- none , dullness-none, rub- none           Chest wall-  Abd- Br/ Gen/ Rectal- Not done, not indicated Extrem- cyanosis- none, clubbing, none, atrophy- none, strength- nl Neuro- grossly intact to observation

## 2013-06-26 ENCOUNTER — Encounter (INDEPENDENT_AMBULATORY_CARE_PROVIDER_SITE_OTHER): Payer: Self-pay

## 2013-06-26 ENCOUNTER — Ambulatory Visit (INDEPENDENT_AMBULATORY_CARE_PROVIDER_SITE_OTHER): Payer: 59 | Admitting: Internal Medicine

## 2013-06-26 ENCOUNTER — Encounter: Payer: Self-pay | Admitting: Internal Medicine

## 2013-06-26 VITALS — BP 126/68 | HR 78 | Ht 72.0 in | Wt 219.0 lb

## 2013-06-26 DIAGNOSIS — G4733 Obstructive sleep apnea (adult) (pediatric): Secondary | ICD-10-CM

## 2013-06-26 DIAGNOSIS — J45909 Unspecified asthma, uncomplicated: Secondary | ICD-10-CM

## 2013-06-26 DIAGNOSIS — J4520 Mild intermittent asthma, uncomplicated: Secondary | ICD-10-CM

## 2013-06-26 NOTE — Patient Instructions (Signed)
We can continue CPAP 16/ [facility]  Please call as needed

## 2013-06-26 NOTE — Progress Notes (Signed)
06/28/11- [name] former smoker followed for OSA, allergic rhinitis, asthma, complicated by GERD LOV- 06/27/10 Wearing CPAP comfortably all night, every night- 16/[facility], nasal mask. He had remote nasal septoplasty with a limited UPPP that just shortened the uvula. No nasal complaints now, ahead of pollen season.  Rare chest tightness with almost no wheeze. He still carries a rescue inhaler sample given last year.  06/26/12- [name] former smoker followed for OSA/ UPPP/CPAP, allergic rhinitis, asthma, complicated by GERD FOLLOWS FOR: wears CPAP 16/ [facility] every night for about 6 hours at least; recently got new mask. Very please with CPAP. No new problems. Planning long flight to [place] in August. Asks about oral appliance for use on plane.  Discussed oral appliances and travel-size CPAP devices for comparison.  06/26/13- [name] former smoker followed for OSA/ UPPP/CPAP, allergic rhinitis, asthma, complicated by GERD FOLLOWS FOR: wears CPAP/ 16/ [facility] every night for about 6-7 hours;pressure works well for patient;  Has been very satisfied with CPAP, reporting good compliance and control with no complaints. Using nasal mask. Has a "boil and  bite" mouthpiece for air travel. Never needs asthma inhaler, has not wheezed in a long time  ROS-see HPI Constitutional:   No-   weight loss, night sweats, fevers, chills, fatigue, lassitude. HEENT:   No-  headaches, difficulty swallowing, tooth/dental problems, sore throat,       No-  sneezing, itching, ear ache, nasal congestion, post nasal drip,  CV:  No-   chest pain, orthopnea, PND, swelling in lower extremities, anasarca, dizziness, palpitations Resp: No-   shortness of breath with exertion or at rest.              No-   productive cough,  No non-productive cough,  No- coughing up of blood.              No-   change in color of mucus.  No- wheezing.   Skin: No-   rash or lesions. GI:  No-   heartburn, indigestion, abdominal pain, nausea, vomiting,  GU:  MS:   No acute  . Neuro-     nothing unusual Psych:  No- change in mood or affect. No depression or anxiety.  No memory loss.  OBJ- Physical Exam General- Alert, Oriented, Affect-appropriate, Distress- none acute,  Skin- rash-none, lesions- none, excoriation- none Lymphadenopathy- none Head- atraumatic            Eyes- Gross vision intact, PERRLA, conjunctivae and secretions clear            Ears- Hearing, canals-normal            Nose- Clear, no-Septal dev, mucus, polyps, erosion, perforation             Throat- Mallampati III- short uvula , mucosa clear , drainage- none, tonsils- atrophic Neck- flexible , trachea midline, no stridor , thyroid nl, carotid no bruit Chest - symmetrical excursion , unlabored           Heart/CV- RRR , no murmur , no gallop  , no rub, nl s1 s2                           - JVD- none , edema- none, stasis changes- none, varices- none           Lung- clear to P&A, wheeze- none, cough- none , dullness-none, rub- none           Chest wall-  Abd-  Br/ Gen/ Rectal- Not done, not indicated Extrem- cyanosis- none, clubbing, none, atrophy- none, strength- nl Neuro- grossly intact to observation

## 2013-07-19 NOTE — Assessment & Plan Note (Signed)
Good compliance and control 

## 2013-07-19 NOTE — Assessment & Plan Note (Signed)
Well controlled 

## 2013-08-13 ENCOUNTER — Other Ambulatory Visit: Payer: Self-pay | Admitting: Gastroenterology

## 2014-06-29 ENCOUNTER — Ambulatory Visit: Payer: 59 | Admitting: Internal Medicine

## 2014-07-13 ENCOUNTER — Encounter: Payer: Self-pay | Admitting: Internal Medicine

## 2014-07-13 ENCOUNTER — Encounter (INDEPENDENT_AMBULATORY_CARE_PROVIDER_SITE_OTHER): Payer: Self-pay

## 2014-07-13 ENCOUNTER — Ambulatory Visit (INDEPENDENT_AMBULATORY_CARE_PROVIDER_SITE_OTHER): Payer: 59 | Admitting: Internal Medicine

## 2014-07-13 VITALS — BP 150/76 | HR 72 | Ht 72.0 in | Wt 214.0 lb

## 2014-07-13 DIAGNOSIS — G4733 Obstructive sleep apnea (adult) (pediatric): Secondary | ICD-10-CM

## 2014-07-13 DIAGNOSIS — J309 Allergic rhinitis, unspecified: Secondary | ICD-10-CM

## 2014-07-13 DIAGNOSIS — J302 Other seasonal allergic rhinitis: Secondary | ICD-10-CM

## 2014-07-13 DIAGNOSIS — J4520 Mild intermittent asthma, uncomplicated: Secondary | ICD-10-CM

## 2014-07-13 DIAGNOSIS — J3089 Other allergic rhinitis: Secondary | ICD-10-CM

## 2014-07-13 NOTE — Assessment & Plan Note (Signed)
Uncomplicated, well controlled. He never uses his rescue inhaler

## 2014-07-13 NOTE — Assessment & Plan Note (Signed)
CPAP remains medically necessary with good compliance and control at 16/[facility]

## 2014-07-13 NOTE — Progress Notes (Signed)
06/28/11- [name] former smoker followed for OSA, allergic rhinitis, asthma, complicated by GERD LOV- 06/27/10 Wearing CPAP comfortably all night, every night- 16/[facility], nasal mask. He had remote nasal septoplasty with a limited UPPP that just shortened the uvula. No nasal complaints now, ahead of pollen season.  Rare chest tightness with almost no wheeze. He still carries a rescue inhaler sample given last year.  06/26/12- [name] former smoker followed for OSA/ UPPP/CPAP, allergic rhinitis, asthma, complicated by GERD FOLLOWS FOR: wears CPAP 16/ [facility] every night for about 6 hours at least; recently got new mask. Very please with CPAP. No new problems. Planning long flight to [place] in August. Asks about oral appliance for use on plane.  Discussed oral appliances and travel-size CPAP devices for comparison.  06/26/13- [name] former smoker followed for OSA/ UPPP/CPAP, allergic rhinitis, asthma, complicated by GERD FOLLOWS FOR: wears CPAP/ [facility]/ [facility] every night for about 6-7 hours;pressure works well for patient;  Has been very satisfied with CPAP, reporting good compliance and control with no complaints. Using nasal mask. Has a "boil and  bite" mouthpiece for air travel. Never needs asthma inhaler, has not wheezed in a long time  07/13/14- 63 yoM former smoker followed for OSA/ UPPP/CPAP, allergic rhinitis, asthma, complicated by GERD FOLLOWS FOR: Wears CPAP 16/ [facility] nightly 6-7 hrs. [facility] DME pt is not having any trouble. He feels he is doing quite well with CPAP at this pressure. Has not encountered spring pollen rhinitis yet. Has a rescue inhaler sample he never uses  ROS-see HPI Constitutional:   No-   weight loss, night sweats, fevers, chills, fatigue, lassitude. HEENT:   No-  headaches, difficulty swallowing, tooth/dental problems, sore throat,       No-  sneezing, itching, ear ache, nasal congestion, post nasal drip,  CV:  No-   chest pain, orthopnea, PND, swelling in lower extremities,  anasarca, dizziness, palpitations Resp: No-   shortness of breath with exertion or at rest.              No-   productive cough,  No non-productive cough,  No- coughing up of blood.              No-   change in color of mucus.  No- wheezing.   Skin: No-   rash or lesions. GI:  No-   heartburn, indigestion, abdominal pain, nausea, vomiting,  GU:  MS:  No acute  . Neuro-     nothing unusual Psych:  No- change in mood or affect. No depression or anxiety.  No memory loss.  OBJ- Physical Exam General- Alert, Oriented, Affect-appropriate, Distress- none acute,  Skin- rash-none, lesions- none, excoriation- none Lymphadenopathy- none Head- atraumatic            Eyes- Gross vision intact, PERRLA, conjunctivae and secretions clear            Ears- Hearing, canals-normal            Nose- Clear, no-Septal dev, mucus, polyps, erosion, perforation             Throat- Mallampati III- short uvula , mucosa clear , drainage- none, tonsils- atrophic Neck- flexible , trachea midline, no stridor , thyroid nl, carotid no bruit Chest - symmetrical excursion , unlabored           Heart/CV- RRR , no murmur , no gallop  , no rub, nl s1 s2                           -  JVD- none , edema- none, stasis changes- none, varices- none           Lung- clear to P&A, wheeze- none, cough- none , dullness-none, rub- none           Chest wall-  Abd- Br/ Gen/ Rectal- Not done, not indicated Extrem- cyanosis- none, clubbing, none, atrophy- none, strength- nl Neuro- grossly intact to observation

## 2014-07-13 NOTE — Assessment & Plan Note (Signed)
We discussed antihistamines and nasal sprays available as needed during upcoming spring pollen

## 2014-07-13 NOTE — Patient Instructions (Signed)
We can continue CPAP 16/ [facility]  Order- [facility] download CPAP pressure compliance   Dx OSA  Ok to call for inhaler refill when needed

## 2015-07-14 ENCOUNTER — Encounter: Payer: Self-pay | Admitting: Internal Medicine

## 2015-07-14 ENCOUNTER — Ambulatory Visit (INDEPENDENT_AMBULATORY_CARE_PROVIDER_SITE_OTHER): Payer: 59 | Admitting: Internal Medicine

## 2015-07-14 VITALS — BP 134/80 | HR 84 | Ht 72.0 in | Wt 215.6 lb

## 2015-07-14 DIAGNOSIS — J3089 Other allergic rhinitis: Secondary | ICD-10-CM

## 2015-07-14 DIAGNOSIS — J309 Allergic rhinitis, unspecified: Secondary | ICD-10-CM

## 2015-07-14 DIAGNOSIS — J302 Other seasonal allergic rhinitis: Secondary | ICD-10-CM

## 2015-07-14 DIAGNOSIS — G4733 Obstructive sleep apnea (adult) (pediatric): Secondary | ICD-10-CM | POA: Diagnosis not present

## 2015-07-14 NOTE — Patient Instructions (Signed)
We can continue CPAP 16/ [facility] as discussed. Please let us know if you need help with anything

## 2015-07-14 NOTE — Progress Notes (Signed)
06/28/11- [name] former smoker followed for OSA, allergic rhinitis, asthma, complicated by GERD LOV- 06/27/10 Wearing CPAP comfortably all night, every night- [facility], nasal mask. He had remote nasal septoplasty with a limited UPPP that just shortened the uvula. No nasal complaints now, ahead of pollen season.  Rare chest tightness with almost no wheeze. He still carries a rescue inhaler sample given last year.  06/26/12- [name] former smoker followed for OSA/ UPPP/CPAP, allergic rhinitis, asthma, complicated by GERD FOLLOWS FOR: wears CPAP 16/ [facility] every night for about 6 hours at least; recently got new mask. Very please with CPAP. No new problems. Planning long flight to [place] in August. Asks about oral appliance for use on plane.  Discussed oral appliances and travel-size CPAP devices for comparison.  06/26/13- [name] former smoker followed for OSA/ UPPP/CPAP, allergic rhinitis, asthma, complicated by GERD FOLLOWS FOR: wears CPAP/ [facility]/ [facility] every night for about 6-7 hours;pressure works well for patient;  Has been very satisfied with CPAP, reporting good compliance and control with no complaints. Using nasal mask. Has a "boil and  bite" mouthpiece for air travel. Never needs asthma inhaler, has not wheezed in a long time  07/13/14- 63 yoM former smoker followed for OSA/ UPPP/CPAP, allergic rhinitis, asthma, complicated by GERD FOLLOWS FOR: Wears CPAP 16/ [facility] nightly 6-7 hrs. [facility] DME pt is not having any trouble. He feels he is doing quite well with CPAP at this pressure. Has not encountered spring pollen rhinitis yet. Has a rescue inhaler sample he never uses  07/14/2015-65 year old male former smoker followed for OSA/UPPP/CPAP, allergic rhinitis, asthma, complicated by GERD CPAP 16/[facility] FOLLOWS FOR: DME: [facility]; pt currently wears CPAP every night; DL attached. He is very happy with CPAP which is improved his quality of life. Sleeping better, no daytime sleepiness, no snoring. Good  download. Breathing well with no seasonal problems yet.  ROS-see HPI Constitutional:   No-   weight loss, night sweats, fevers, chills, fatigue, lassitude. HEENT:   No-  headaches, difficulty swallowing, tooth/dental problems, sore throat,       No-  sneezing, itching, ear ache, nasal congestion, post nasal drip,  CV:  No-   chest pain, orthopnea, PND, swelling in lower extremities, anasarca, dizziness, palpitations Resp: No-   shortness of breath with exertion or at rest.              No-   productive cough,  No non-productive cough,  No- coughing up of blood.              No-   change in color of mucus.  No- wheezing.   Skin: No-   rash or lesions. GI:  No-   heartburn, indigestion, abdominal pain, nausea, vomiting,  GU:  MS:  No acute  . Neuro-     nothing unusual Psych:  No- change in mood or affect. No depression or anxiety.  No memory loss.  OBJ- Physical Exam General- Alert, Oriented, Affect-appropriate, Distress- none acute, + overweight Skin- rash-none, lesions- none, excoriation- none Lymphadenopathy- none Head- atraumatic            Eyes- Gross vision intact, PERRLA, conjunctivae and secretions clear            Ears- Hearing, canals-normal            Nose- Clear, no-Septal dev, mucus, polyps, erosion, perforation             Throat- Mallampati III- short uvula , mucosa clear , drainage- none, tonsils-  atrophic Neck- flexible , trachea midline, no stridor , thyroid nl, carotid no bruit Chest - symmetrical excursion , unlabored           Heart/CV- RRR , no murmur , no gallop  , no rub, nl s1 s2                           - JVD- none , edema- none, stasis changes- none, varices- none           Lung- clear to P&A, wheeze- none, cough- none , dullness-none, rub- none           Chest wall-  Abd- Br/ Gen/ Rectal- Not done, not indicated Extrem- cyanosis- none, clubbing, none, atrophy- none, strength- nl Neuro- grossly intact to observation

## 2015-07-14 NOTE — Assessment & Plan Note (Signed)
No seasonal exacerbation so far. OTC medications will be available if needed.

## 2015-07-14 NOTE — Assessment & Plan Note (Addendum)
He is very satisfied with CPAP which is improved his life. Download good. Continues CPAP 16/[facility]

## 2015-07-19 ENCOUNTER — Encounter: Payer: Self-pay | Admitting: Internal Medicine

## 2016-02-20 ENCOUNTER — Telehealth: Payer: Self-pay | Admitting: Internal Medicine

## 2016-02-20 DIAGNOSIS — G4733 Obstructive sleep apnea (adult) (pediatric): Secondary | ICD-10-CM

## 2016-02-20 NOTE — Telephone Encounter (Signed)
[facility] for dx OSA

## 2016-02-20 NOTE — Telephone Encounter (Signed)
Spoke with pt. States that he is having issues with [facility] services. He is wanting to switch to another DME. Pt would like CY's recommendation on another DME.  CY - please advise. Thanks.

## 2016-02-20 NOTE — Telephone Encounter (Signed)
Spoke with pt. He is aware of CY's recommendation. Order has been placed to change DME. Nothing further was needed at this time.

## 2016-07-12 ENCOUNTER — Encounter: Payer: Self-pay | Admitting: Internal Medicine

## 2016-07-13 ENCOUNTER — Ambulatory Visit (INDEPENDENT_AMBULATORY_CARE_PROVIDER_SITE_OTHER): Payer: BLUE CROSS/BLUE SHIELD | Admitting: Internal Medicine

## 2016-07-13 ENCOUNTER — Encounter: Payer: Self-pay | Admitting: Internal Medicine

## 2016-07-13 VITALS — BP 128/74 | HR 78 | Ht 72.0 in | Wt 212.8 lb

## 2016-07-13 DIAGNOSIS — G4733 Obstructive sleep apnea (adult) (pediatric): Secondary | ICD-10-CM | POA: Diagnosis not present

## 2016-07-13 DIAGNOSIS — J4520 Mild intermittent asthma, uncomplicated: Secondary | ICD-10-CM

## 2016-07-13 MED ORDER — ALBUTEROL SULFATE HFA 108 (90 BASE) MCG/ACT IN AERS: 2.0000 | INHALATION_SPRAY | Freq: Four times a day (QID) | RESPIRATORY_TRACT | 99 refills | Status: DC | PRN

## 2016-07-13 NOTE — Assessment & Plan Note (Signed)
Very rare asthma now, improved from earlier years. We will keep an albuterol inhaler available to him. No routine wheezing and no associated sleep disturbance.

## 2016-07-13 NOTE — Assessment & Plan Note (Signed)
He does very well with CPAP and recognizes quality of life improvement is maintained as he continues using it. Pressure is good. He would like to try a different DME company and may be eligible for replacement of old machine. We would take opportunity to change to auto Pap. We also discussed small travel CPAP machines because he travels frequently and we can printed prescription so he can look into this

## 2016-07-13 NOTE — Patient Instructions (Addendum)
[facility], seeking better service Evaluate eligibility for new CPAP machine, auto 10-20, mask of choice, humidifier, supplies, AirView  Dx OSA  Print script for travel CPAP machine of choice, auto 10-20, mask of choice, supplies, humidifier if needed,   Dx OSA    Script refill albuterol HFA rescue inhaler printed  Please call if we can help

## 2016-07-13 NOTE — Progress Notes (Signed)
HPI male former smoker followed for OSA/UPPP/CPAP, allergic rhinitis, asthma, complicated by GERD NPSG 04/10/1999- AHI 80/ hr, desaturation to 74%, body weight 200 lbs CPAP titrated to 16 --------------------------------------------------------------------------------  07/14/2015-66 year old male former smoker followed for OSA/UPPP/CPAP, allergic rhinitis, asthma, complicated by GERD CPAP 16/[facility] FOLLOWS FOR: DME: [facility]; pt currently wears CPAP every night; DL attached. He is very happy with CPAP which is improved his quality of life. Sleeping better, no daytime sleepiness, no snoring. Good download. Breathing well with no seasonal problems yet.  223/2018-66 year old male former smoker followed for OSA/UPPP/CPAP, allergic rhinitis, asthma, complicated by GERD, prediabetes, HBP CPAP 16/[facility]> today new DME  FOLLOWS FOR: DME: [facility]-would like to change DME; Pt wears CPAP nighlty and DL attached. Pt would like to get order for new supplies as well.  Interested in travel CPAP machines which we discussed. Denies breakthrough snoring or daytime sleepiness. Download good as reviewed with him-97% compliance, AHI 1.8/hour. Machine is 61 or 64 years old Denies wheeze and very rarely uses his Ventolin rescue inhaler. We agreed to refill to keep it available.  ROS-see HPI Constitutional:   No-   weight loss, night sweats, fevers, chills, fatigue, lassitude. HEENT:   No-  headaches, difficulty swallowing, tooth/dental problems, sore throat,       No-  sneezing, itching, ear ache, nasal congestion, post nasal drip,  CV:  No-   chest pain, orthopnea, PND, swelling in lower extremities, anasarca, dizziness, palpitations Resp: No-   shortness of breath with exertion or at rest.              No-   productive cough,  No non-productive cough,  No- coughing up of blood.              No-   change in color of mucus.  No- wheezing.   Skin: No-   rash or lesions. GI:  No-   heartburn, indigestion, abdominal pain,  nausea, vomiting,  GU:  MS:  No acute  . Neuro-     nothing unusual Psych:  No- change in mood or affect. No depression or anxiety.  No memory loss.  OBJ- Physical Exam  stable baseline exam General- Alert, Oriented, Affect-appropriate, Distress- none acute, + overweight Skin- rash-none, lesions- none, excoriation- none Lymphadenopathy- none Head- atraumatic            Eyes- Gross vision intact, PERRLA, conjunctivae and secretions clear            Ears- Hearing, canals-normal            Nose- Clear, no-Septal dev, mucus, polyps, erosion, perforation             Throat- Mallampati III- short uvula , mucosa clear , drainage- none, tonsils- atrophic Neck- flexible , trachea midline, no stridor , thyroid nl, carotid no bruit Chest - symmetrical excursion , unlabored           Heart/CV- RRR , no murmur , no gallop  , no rub, nl s1 s2                           - JVD- none , edema- none, stasis changes- none, varices- none           Lung- clear to P&A, wheeze- none, cough- none , dullness-none, rub- none           Chest wall-  Abd- Br/ Gen/ Rectal- Not done, not indicated Extrem- cyanosis- none, clubbing, none, atrophy- none,  strength- nl Neuro- grossly intact to observation

## 2016-10-31 DIAGNOSIS — G4733 Obstructive sleep apnea (adult) (pediatric): Secondary | ICD-10-CM | POA: Diagnosis not present

## 2016-10-31 DIAGNOSIS — K2270 Barrett's esophagus without dysplasia: Secondary | ICD-10-CM | POA: Diagnosis not present

## 2016-10-31 DIAGNOSIS — E119 Type 2 diabetes mellitus without complications: Secondary | ICD-10-CM | POA: Diagnosis not present

## 2016-10-31 DIAGNOSIS — I10 Essential (primary) hypertension: Secondary | ICD-10-CM | POA: Diagnosis not present

## 2017-02-15 DIAGNOSIS — Z23 Encounter for immunization: Secondary | ICD-10-CM | POA: Diagnosis not present

## 2017-02-18 DIAGNOSIS — G4733 Obstructive sleep apnea (adult) (pediatric): Secondary | ICD-10-CM | POA: Diagnosis not present

## 2017-05-07 DIAGNOSIS — Z1389 Encounter for screening for other disorder: Secondary | ICD-10-CM | POA: Diagnosis not present

## 2017-05-07 DIAGNOSIS — Z0000 Encounter for general adult medical examination without abnormal findings: Secondary | ICD-10-CM | POA: Diagnosis not present

## 2017-05-07 DIAGNOSIS — I10 Essential (primary) hypertension: Secondary | ICD-10-CM | POA: Diagnosis not present

## 2017-05-07 DIAGNOSIS — E113293 Type 2 diabetes mellitus with mild nonproliferative diabetic retinopathy without macular edema, bilateral: Secondary | ICD-10-CM | POA: Diagnosis not present

## 2017-07-15 ENCOUNTER — Encounter: Payer: Self-pay | Admitting: Internal Medicine

## 2017-07-15 ENCOUNTER — Ambulatory Visit (INDEPENDENT_AMBULATORY_CARE_PROVIDER_SITE_OTHER): Payer: BLUE CROSS/BLUE SHIELD | Admitting: Internal Medicine

## 2017-07-15 VITALS — BP 140/80 | HR 74 | Ht 72.0 in | Wt 214.0 lb

## 2017-07-15 DIAGNOSIS — J4520 Mild intermittent asthma, uncomplicated: Secondary | ICD-10-CM

## 2017-07-15 DIAGNOSIS — G4733 Obstructive sleep apnea (adult) (pediatric): Secondary | ICD-10-CM | POA: Diagnosis not present

## 2017-07-15 NOTE — Assessment & Plan Note (Signed)
Excellent stable compliance and control.  Plan- DME [facility] to replace old CPAP machine, auto 10-20, mask of choice, humidifier, supplies, AirView

## 2017-07-15 NOTE — Progress Notes (Signed)
HPI male former smoker followed for OSA/UPPP/CPAP, allergic rhinitis, asthma, complicated by GERD NPSG 04/10/1999- AHI 80/ hr, desaturation to 74%, body weight 200 lbs CPAP titrated to 16 -------------------------------------------------------------------------------  223/2018-67 year old male former smoker followed for OSA/UPPP/CPAP, allergic rhinitis, asthma, complicated by GERD, prediabetes, HBP CPAP 16/[facility]> today new DME  FOLLOWS FOR: DME: [facility]-would like to change DME; Pt wears CPAP nighlty and DL attached. Pt would like to get order for new supplies as well.  Interested in travel CPAP machines which we discussed. Denies breakthrough snoring or daytime sleepiness. Download good as reviewed with him-97% compliance, AHI 1.8/hour. Machine is 415 or 67 years old Denies wheeze and very rarely uses his Ventolin rescue inhaler. We agreed to refill to keep it available.  07/15/16- 67 year old male former smoker followed for OSA/UPPP/CPAP, allergic rhinitis, asthma, complicated by GERD, prediabetes, HBP CPAP 16/[facility]> today new DME  ----OSA; DME:[facility] per pt. Pt states he uses CPAP every night and did not bring his SD Card. Pt never heard back from [facility] to get set up with new machine 07-18-16 [facility]. [facility]  He now wants replacement CPAP to come through [facility]. He remains fully compliant, always sleeping with CPAP, sleeps better with it. Asthma control is very good. No exacerbations or rescue use.   ROS-see HPI + = positive Constitutional:   No-   weight loss, night sweats, fevers, chills, fatigue, lassitude. HEENT:   No-  headaches, difficulty swallowing, tooth/dental problems, sore throat,       No-  sneezing, itching, ear ache, nasal congestion, post nasal drip,  CV:  No-   chest pain, orthopnea, PND, swelling in lower extremities, anasarca, dizziness, palpitations Resp: No-   shortness of breath with exertion or at rest.          No-   productive cough,  No non-productive cough,  No- coughing up of blood.              No-   change in color of mucus.  No- wheezing.   Skin: No-   rash or lesions. GI:  No-   heartburn, indigestion, abdominal pain, nausea, vomiting,  GU:  MS:  No acute  . Neuro-     nothing unusual Psych:  No- change in mood or affect. No depression or anxiety.  No memory loss.  OBJ- Physical Exam   General- Alert, Oriented, Affect-appropriate, Distress- none acute,  Skin- rash-none, lesions- none, excoriation- none Lymphadenopathy- none Head- atraumatic            Eyes- Gross vision intact, PERRLA, conjunctivae and secretions clear            Ears- Hearing, canals-normal            Nose- Clear, no-Septal dev, mucus, polyps, erosion, perforation             Throat- Mallampati III- short uvula , mucosa clear , drainage- none, tonsils- atrophic Neck- flexible , trachea midline, no stridor , thyroid nl, carotid no bruit Chest - symmetrical excursion , unlabored           Heart/CV- RRR , no murmur , no gallop  , no rub, nl s1 s2                           - JVD- none , edema- none, stasis changes- none, varices- none  Lung- clear to P&A, wheeze- none, cough- none , dullness-none, rub- none           Chest wall-  Abd- Br/ Gen/ Rectal- Not done, not indicated Extrem- cyanosis- none, clubbing, none, atrophy- none, strength- nl Neuro- grossly intact to observation

## 2017-07-15 NOTE — Assessment & Plan Note (Signed)
Mild intermittent uncomplicated. No concerns.

## 2017-07-15 NOTE — Patient Instructions (Addendum)
Order- D[facility]- please replace old CPAP machine, auto 10-20, mask of choice, humidifier, supplies, AirView      No break int therapy    Dx OSA  Please call if there are any problems getting your machine, or if we can help with anything.

## 2017-07-23 DIAGNOSIS — G4733 Obstructive sleep apnea (adult) (pediatric): Secondary | ICD-10-CM | POA: Diagnosis not present

## 2017-09-04 ENCOUNTER — Encounter: Payer: Self-pay | Admitting: Internal Medicine

## 2017-09-05 ENCOUNTER — Encounter: Payer: Self-pay | Admitting: Internal Medicine

## 2017-09-05 ENCOUNTER — Ambulatory Visit (INDEPENDENT_AMBULATORY_CARE_PROVIDER_SITE_OTHER): Payer: BLUE CROSS/BLUE SHIELD | Admitting: Internal Medicine

## 2017-09-05 DIAGNOSIS — G4733 Obstructive sleep apnea (adult) (pediatric): Secondary | ICD-10-CM

## 2017-09-05 DIAGNOSIS — J4520 Mild intermittent asthma, uncomplicated: Secondary | ICD-10-CM | POA: Diagnosis not present

## 2017-09-05 NOTE — Progress Notes (Signed)
HPI male former smoker followed for OSA/UPPP/CPAP, allergic rhinitis, asthma, complicated by GERD NPSG 04/10/1999- AHI 80/ hr, desaturation to 74%, body weight 200 lbs CPAP titrated to 16 -------------------------------------------------------------------------------  07/15/16- 67 year old male former smoker followed for OSA/UPPP/CPAP, allergic rhinitis, asthma, complicated by GERD, prediabetes, HBP CPAP [facility]> today new DME  ----OSA; DME:[facility] per pt. Pt states he uses CPAP every night and did not bring his SD Card. Pt never heard back from [facility] to get set up with new machine 07-18-16 and went to [facility] on his own for supplies. Need to order new CPAP  to [facility].  He now wants replacement CPAP to come through [facility]. He remains fully compliant, always sleeping with CPAP, sleeps better with it. Asthma control is very good. No exacerbations or rescue use.   09/06/2016- 67 year old male former smoker followed for OSA/UPPP/CPAP, allergic rhinitis, asthma, complicated by GERD, prediabetes, HBP CPAP auto 10-20/ [facility]  ----Pt states the new CPAP has worked well for him. DME: [facility] sleeping well, no snoring. Download 100% compliance AHI 3.4/hour Has [employer] machine-satisfied that it works well. Asthma control continues with rare need for rescue inhaler.  ROS-see HPI + = positive Constitutional:   No-   weight loss, night sweats, fevers, chills, fatigue, lassitude. HEENT:   No-  headaches, difficulty swallowing, tooth/dental problems, sore throat,       No-  sneezing, itching, ear ache, nasal congestion, post nasal drip,  CV:  No-   chest pain, orthopnea, PND, swelling in lower extremities, anasarca, dizziness, palpitations Resp: No-   shortness of breath with exertion or at rest.              No-   productive cough,  No non-productive cough,  No- coughing up of blood.              No-   change in color of mucus.  No- wheezing.   Skin: No-   rash or lesions. GI:  No-   heartburn, indigestion,  abdominal pain, nausea, vomiting,  GU:  MS:  No acute  . Neuro-     nothing unusual Psych:  No- change in mood or affect. No depression or anxiety.  No memory loss.  OBJ- Physical Exam   General- Alert, Oriented, Affect-appropriate, Distress- none acute, + overweight Skin- rash-none, lesions- none, excoriation- none Lymphadenopathy- none Head- atraumatic            Eyes- Gross vision intact, PERRLA, conjunctivae and secretions clear            Ears- Hearing, canals-normal            Nose- Clear, no-Septal dev, mucus, polyps, erosion, perforation             Throat- Mallampati III- short uvula , mucosa clear , drainage- none, tonsils- atrophic Neck- flexible , trachea midline, no stridor , thyroid nl, carotid no bruit Chest - symmetrical excursion , unlabored           Heart/CV- RRR , no murmur , no gallop  , no rub, nl s1 s2                           - JVD- none , edema- none, stasis changes- none, varices- none           Lung- clear to P&A, wheeze- none, cough- none , dullness-none, rub- none           Chest wall-  Abd-  Br/ Gen/ Rectal- Not done, not indicated Extrem- cyanosis- none, clubbing, none, atrophy- none, strength- nl Neuro- grossly intact to observation

## 2017-09-05 NOTE — Patient Instructions (Addendum)
We can continue CPAP auto 10-20,  Mask of choice, humidifier, supplies, AirView       Please call if we can help  Ok to keep the February 26 appointment

## 2017-09-10 NOTE — Assessment & Plan Note (Signed)
Only rarely needing rescue inhaler.  No seasonal exacerbations related to pollen currently.

## 2017-09-10 NOTE — Assessment & Plan Note (Signed)
He is doing very well with CPAP, compliant and definitely sleeping better.  Feels better rested when he uses it..  No concerns and no changes needed.

## 2017-10-01 DIAGNOSIS — E119 Type 2 diabetes mellitus without complications: Secondary | ICD-10-CM | POA: Diagnosis not present

## 2017-10-01 DIAGNOSIS — H35033 Hypertensive retinopathy, bilateral: Secondary | ICD-10-CM | POA: Diagnosis not present

## 2017-11-06 DIAGNOSIS — K2270 Barrett's esophagus without dysplasia: Secondary | ICD-10-CM | POA: Diagnosis not present

## 2017-11-06 DIAGNOSIS — G4733 Obstructive sleep apnea (adult) (pediatric): Secondary | ICD-10-CM | POA: Diagnosis not present

## 2017-11-06 DIAGNOSIS — E113293 Type 2 diabetes mellitus with mild nonproliferative diabetic retinopathy without macular edema, bilateral: Secondary | ICD-10-CM | POA: Diagnosis not present

## 2017-11-06 DIAGNOSIS — I10 Essential (primary) hypertension: Secondary | ICD-10-CM | POA: Diagnosis not present

## 2018-05-08 DIAGNOSIS — Z0000 Encounter for general adult medical examination without abnormal findings: Secondary | ICD-10-CM | POA: Diagnosis not present

## 2018-05-08 DIAGNOSIS — Z125 Encounter for screening for malignant neoplasm of prostate: Secondary | ICD-10-CM | POA: Diagnosis not present

## 2018-05-08 DIAGNOSIS — I10 Essential (primary) hypertension: Secondary | ICD-10-CM | POA: Diagnosis not present

## 2018-05-08 DIAGNOSIS — E119 Type 2 diabetes mellitus without complications: Secondary | ICD-10-CM | POA: Diagnosis not present

## 2018-05-08 DIAGNOSIS — J309 Allergic rhinitis, unspecified: Secondary | ICD-10-CM | POA: Diagnosis not present

## 2018-05-08 DIAGNOSIS — Z23 Encounter for immunization: Secondary | ICD-10-CM | POA: Diagnosis not present

## 2018-05-08 DIAGNOSIS — E113293 Type 2 diabetes mellitus with mild nonproliferative diabetic retinopathy without macular edema, bilateral: Secondary | ICD-10-CM | POA: Diagnosis not present

## 2018-05-08 DIAGNOSIS — Z1389 Encounter for screening for other disorder: Secondary | ICD-10-CM | POA: Diagnosis not present

## 2018-07-04 DIAGNOSIS — G4733 Obstructive sleep apnea (adult) (pediatric): Secondary | ICD-10-CM | POA: Diagnosis not present

## 2018-07-14 ENCOUNTER — Encounter: Payer: Self-pay | Admitting: Internal Medicine

## 2018-07-16 ENCOUNTER — Ambulatory Visit (INDEPENDENT_AMBULATORY_CARE_PROVIDER_SITE_OTHER): Payer: BLUE CROSS/BLUE SHIELD | Admitting: Internal Medicine

## 2018-07-16 ENCOUNTER — Encounter: Payer: Self-pay | Admitting: Internal Medicine

## 2018-07-16 VITALS — BP 144/82 | HR 71 | Ht 71.0 in | Wt 219.4 lb

## 2018-07-16 DIAGNOSIS — J4520 Mild intermittent asthma, uncomplicated: Secondary | ICD-10-CM

## 2018-07-16 DIAGNOSIS — G4733 Obstructive sleep apnea (adult) (pediatric): Secondary | ICD-10-CM | POA: Diagnosis not present

## 2018-07-16 MED ORDER — ALBUTEROL SULFATE HFA 108 (90 BASE) MCG/ACT IN AERS: 2.0000 | INHALATION_SPRAY | Freq: Four times a day (QID) | RESPIRATORY_TRACT | 99 refills | Status: DC | PRN

## 2018-07-16 NOTE — Assessment & Plan Note (Signed)
He continues to benefit from CPAP with good sleep.  Download confirms excellent compliance and control. Plan-continue CPAP auto 10-20 replace mask and supplies when needed

## 2018-07-16 NOTE — Patient Instructions (Signed)
We can continue CPAP auto 10-20, mask of choice, humidifier, supplies, Airview/ card  Please call if we can help 

## 2018-07-16 NOTE — Assessment & Plan Note (Signed)
Rare need for rescue inhaler with no associated sleep disturbance.

## 2018-07-16 NOTE — Progress Notes (Signed)
HPI male former smoker followed for OSA/UPPP/CPAP, allergic rhinitis, asthma, complicated by GERD NPSG 04/10/1999- AHI 80/ hr, desaturation to 74%, body weight 200 lbs CPAP titrated to 16 -------------------------------------------------------------------------------  09/06/2016- 68 year old male former smoker followed for OSA/UPPP/CPAP, allergic rhinitis, asthma, complicated by GERD, prediabetes, HBP CPAP auto 10-20/ [facility]  ----Pt states the new CPAP has worked well for him. DME: [facility] sleeping well, no snoring. Download 100% compliance AHI 3.4/hour Has So Clean machine-satisfied that it works well. Asthma control continues with rare need for rescue inhaler.  07/16/2018- 68 year old male former smoker followed for OSA/UPPP/CPAP, allergic rhinitis, asthma, complicated by GERD, prediabetes, HBP CPAP auto 10-20/ [facility]  Download 93% compliance AHI 2.4/hour Body weight 219 pounds He feels he is doing very well.  Pressure is comfortable.  We reviewed download compliance is fine.  He denies other pertinent medical problems at this visit.  ROS-see HPI + = positive Constitutional:   No-   weight loss, night sweats, fevers, chills, fatigue, lassitude. HEENT:   No-  headaches, difficulty swallowing, tooth/dental problems, sore throat,       No-  sneezing, itching, ear ache, nasal congestion, post nasal drip,  CV:  No-   chest pain, orthopnea, PND, swelling in lower extremities, anasarca, dizziness, palpitations Resp: No-   shortness of breath with exertion or at rest.              No-   productive cough,  No non-productive cough,  No- coughing up of blood.              No-   change in color of mucus.  No- wheezing.   Skin: No-   rash or lesions. GI:  No-   heartburn, indigestion, abdominal pain, nausea, vomiting,  GU:  MS:  No acute  . Neuro-     nothing unusual Psych:  No- change in mood or affect. No depression or anxiety.  No memory loss.  OBJ- Physical Exam   General- Alert, Oriented,  Affect-appropriate, Distress- none acute, + overweight Skin- rash-none, lesions- none, excoriation- none Lymphadenopathy- none Head- atraumatic            Eyes- Gross vision intact, PERRLA, conjunctivae and secretions clear            Ears- Hearing, canals-normal            Nose- Clear, no-Septal dev, mucus, polyps, erosion, perforation             Throat- Mallampati III- short uvula , mucosa clear , drainage- none, tonsils- atrophic Neck- flexible , trachea midline, no stridor , thyroid nl, carotid no bruit Chest - symmetrical excursion , unlabored           Heart/CV- RRR , no murmur , no gallop  , no rub, nl s1 s2                           - JVD- none , edema- none, stasis changes- none, varices- none           Lung- clear to P&A, wheeze- none, cough- none , dullness-none, rub- none           Chest wall-  Abd- Br/ Gen/ Rectal- Not done, not indicated Extrem- cyanosis- none, clubbing, none, atrophy- none, strength- nl Neuro- grossly intact to observation

## 2018-08-19 DIAGNOSIS — R21 Rash and other nonspecific skin eruption: Secondary | ICD-10-CM | POA: Diagnosis not present

## 2018-11-27 DIAGNOSIS — K2270 Barrett's esophagus without dysplasia: Secondary | ICD-10-CM | POA: Diagnosis not present

## 2018-11-27 DIAGNOSIS — I10 Essential (primary) hypertension: Secondary | ICD-10-CM | POA: Diagnosis not present

## 2018-11-27 DIAGNOSIS — E113293 Type 2 diabetes mellitus with mild nonproliferative diabetic retinopathy without macular edema, bilateral: Secondary | ICD-10-CM | POA: Diagnosis not present

## 2018-11-27 DIAGNOSIS — G4733 Obstructive sleep apnea (adult) (pediatric): Secondary | ICD-10-CM | POA: Diagnosis not present

## 2018-12-03 DIAGNOSIS — E113293 Type 2 diabetes mellitus with mild nonproliferative diabetic retinopathy without macular edema, bilateral: Secondary | ICD-10-CM | POA: Diagnosis not present

## 2018-12-17 DIAGNOSIS — H5203 Hypermetropia, bilateral: Secondary | ICD-10-CM | POA: Diagnosis not present

## 2019-05-06 DIAGNOSIS — Z0000 Encounter for general adult medical examination without abnormal findings: Secondary | ICD-10-CM | POA: Diagnosis not present

## 2019-05-06 DIAGNOSIS — I10 Essential (primary) hypertension: Secondary | ICD-10-CM | POA: Diagnosis not present

## 2019-05-06 DIAGNOSIS — Z125 Encounter for screening for malignant neoplasm of prostate: Secondary | ICD-10-CM | POA: Diagnosis not present

## 2019-05-06 DIAGNOSIS — Z13220 Encounter for screening for lipoid disorders: Secondary | ICD-10-CM | POA: Diagnosis not present

## 2019-05-06 DIAGNOSIS — K2270 Barrett's esophagus without dysplasia: Secondary | ICD-10-CM | POA: Diagnosis not present

## 2019-05-06 DIAGNOSIS — Z1389 Encounter for screening for other disorder: Secondary | ICD-10-CM | POA: Diagnosis not present

## 2019-05-06 DIAGNOSIS — Z23 Encounter for immunization: Secondary | ICD-10-CM | POA: Diagnosis not present

## 2019-05-06 DIAGNOSIS — E113293 Type 2 diabetes mellitus with mild nonproliferative diabetic retinopathy without macular edema, bilateral: Secondary | ICD-10-CM | POA: Diagnosis not present

## 2019-06-17 ENCOUNTER — Ambulatory Visit: Payer: BLUE CROSS/BLUE SHIELD

## 2019-06-19 DIAGNOSIS — Z86010 Personal history of colonic polyps: Secondary | ICD-10-CM | POA: Diagnosis not present

## 2019-06-19 DIAGNOSIS — K2270 Barrett's esophagus without dysplasia: Secondary | ICD-10-CM | POA: Diagnosis not present

## 2019-06-19 DIAGNOSIS — K219 Gastro-esophageal reflux disease without esophagitis: Secondary | ICD-10-CM | POA: Diagnosis not present

## 2019-06-26 ENCOUNTER — Ambulatory Visit: Payer: BC Managed Care – PPO | Attending: Internal Medicine

## 2019-06-26 DIAGNOSIS — Z23 Encounter for immunization: Secondary | ICD-10-CM | POA: Insufficient documentation

## 2019-06-26 NOTE — Progress Notes (Signed)
   Covid-19 Vaccination Clinic  Name:  Todd Gentry    MRN: [id number] DOB: 01-16-1951  06/26/2019  [name] was observed post Covid-19 immunization for 15 minutes without incidence. He was provided with Vaccine Information Sheet and instruction to access the V-Safe system.   [name] was instructed to call 911 with any severe reactions post vaccine: Marland Kitchen Difficulty breathing  . Swelling of your face and throat  . A fast heartbeat  . A bad rash all over your body  . Dizziness and weakness    Immunizations Administered    Name Date Dose VIS Date Route   Pfizer COVID-19 Vaccine 06/26/2019  2:41 PM 0.3 mL 05/01/2019 Intramuscular   Manufacturer: [employer], [employer]   Lot: [id number]   NDC: [address]-8

## 2019-07-04 ENCOUNTER — Ambulatory Visit: Payer: BLUE CROSS/BLUE SHIELD

## 2019-07-17 ENCOUNTER — Encounter: Payer: Self-pay | Admitting: Internal Medicine

## 2019-07-17 ENCOUNTER — Other Ambulatory Visit: Payer: Self-pay

## 2019-07-17 ENCOUNTER — Ambulatory Visit (INDEPENDENT_AMBULATORY_CARE_PROVIDER_SITE_OTHER): Payer: BC Managed Care – PPO | Admitting: Internal Medicine

## 2019-07-17 DIAGNOSIS — J4520 Mild intermittent asthma, uncomplicated: Secondary | ICD-10-CM

## 2019-07-17 DIAGNOSIS — G4733 Obstructive sleep apnea (adult) (pediatric): Secondary | ICD-10-CM | POA: Diagnosis not present

## 2019-07-17 NOTE — Assessment & Plan Note (Signed)
Mild intermittent uncomplicated. No recent need for rescue inhaler. [place] is clear today.

## 2019-07-17 NOTE — Patient Instructions (Signed)
We can continue CPAP auto 10-20, mask of choice, humidifier, supplies, AirView/ card  Please call if we can help 

## 2019-07-17 NOTE — Assessment & Plan Note (Signed)
Continues to benefit from CPAP. Download confirms good compliance and control. Plan continue auto 10--20

## 2019-07-17 NOTE — Progress Notes (Signed)
HPI male former smoker followed for OSA/UPPP/CPAP, allergic rhinitis, asthma, complicated by GERD NPSG 04/10/1999- AHI 80/ hr, desaturation to 74%, body weight 200 lbs CPAP titrated to 16 -------------------------------------------------------------------------------  07/16/2018- 69 year old male former smoker followed for OSA/UPPP/CPAP, allergic rhinitis, asthma, complicated by GERD, prediabetes, HBP CPAP auto 10-20/ [facility]  Download 93% compliance AHI 2.4/hour Body weight 219 pounds He feels he is doing very well.  Pressure is comfortable.  We reviewed download compliance is fine.  He denies other pertinent medical problems at this visit.  07/17/19- 69 year old male former smoker followed for OSA/UPPP/CPAP, allergic rhinitis, asthma, complicated by GERD, prediabetes, HBP CPAP auto 10-20/ [facility]  Body weight 216 lbs Download compliance 100%, AHI 1.7/ hr Doing very well with CPAP. Likes autopap. No problems. Denies other interval health issues or changes to report.  No recent need for rescue inhaler.  ROS-see HPI + = positive Constitutional:   No-   weight loss, night sweats, fevers, chills, fatigue, lassitude. HEENT:   No-  headaches, difficulty swallowing, tooth/dental problems, sore throat,       No-  sneezing, itching, ear ache, nasal congestion, post nasal drip,  CV:  No-   chest pain, orthopnea, PND, swelling in lower extremities, anasarca, dizziness, palpitations Resp: No-   shortness of breath with exertion or at rest.              No-   productive cough,  No non-productive cough,  No- coughing up of blood.              No-   change in color of mucus.  No- wheezing.   Skin: No-   rash or lesions. GI:  No-   heartburn, indigestion, abdominal pain, nausea, vomiting,  GU:  MS:  No acute  . Neuro-     nothing unusual Psych:  No- change in mood or affect. No depression or anxiety.  No memory loss.  OBJ- Physical Exam   General- Alert, Oriented, Affect-appropriate, Distress- none  acute, + overweight Skin- rash-none, lesions- none, excoriation- none Lymphadenopathy- none Head- atraumatic            Eyes- Gross vision intact, PERRLA, conjunctivae and secretions clear            Ears- Hearing, canals-normal            Nose- Clear, no-Septal dev, mucus, polyps, erosion, perforation             Throat- Mallampati III- short uvula , mucosa clear , drainage- none, tonsils- atrophic Neck- flexible , trachea midline, no stridor , thyroid nl, carotid no bruit Chest - symmetrical excursion , unlabored           Heart/CV- RRR , no murmur , no gallop  , no rub, nl s1 s2                           - JVD- none , edema- none, stasis changes- none, varices- none           Lung- clear to P&A, wheeze- none, cough- none , dullness-none, rub- none           Chest wall-  Abd- Br/ Gen/ Rectal- Not done, not indicated Extrem- cyanosis- none, clubbing, none, atrophy- none, strength- nl Neuro- grossly intact to observation

## 2019-07-21 ENCOUNTER — Ambulatory Visit: Payer: BC Managed Care – PPO | Attending: Internal Medicine

## 2019-07-21 DIAGNOSIS — Z23 Encounter for immunization: Secondary | ICD-10-CM | POA: Insufficient documentation

## 2019-07-21 NOTE — Progress Notes (Signed)
   Covid-19 Vaccination Clinic  Name:  Todd Gentry    MRN: [id number] DOB: [birth year]  07/21/2019  Todd Gentry was observed post Covid-19 immunization for 15 minutes without incident. He was provided with Vaccine Information Sheet and instruction to access the V-Safe system.   Todd Gentry was instructed to call 911 with any severe reactions post vaccine: Marland Kitchen Difficulty breathing  . Swelling of face and throat  . A fast heartbeat  . A bad rash all over body  . Dizziness and weakness   Immunizations Administered    Name Date Dose VIS Date Route   Pfizer COVID-19 Vaccine 07/21/2019  2:06 PM 0.3 mL 05/01/2019 Intramuscular   Manufacturer: [employer], [employer]   Lot: [id number]   NDC: [address]-2

## 2019-07-28 DIAGNOSIS — Z1159 Encounter for screening for other viral diseases: Secondary | ICD-10-CM | POA: Diagnosis not present

## 2019-07-31 DIAGNOSIS — K649 Unspecified hemorrhoids: Secondary | ICD-10-CM | POA: Diagnosis not present

## 2019-07-31 DIAGNOSIS — K635 Polyp of colon: Secondary | ICD-10-CM | POA: Diagnosis not present

## 2019-07-31 DIAGNOSIS — D124 Benign neoplasm of descending colon: Secondary | ICD-10-CM | POA: Diagnosis not present

## 2019-07-31 DIAGNOSIS — K2270 Barrett's esophagus without dysplasia: Secondary | ICD-10-CM | POA: Diagnosis not present

## 2019-07-31 DIAGNOSIS — Z86010 Personal history of colonic polyps: Secondary | ICD-10-CM | POA: Diagnosis not present

## 2019-07-31 DIAGNOSIS — D123 Benign neoplasm of transverse colon: Secondary | ICD-10-CM | POA: Diagnosis not present

## 2019-08-25 DIAGNOSIS — G4733 Obstructive sleep apnea (adult) (pediatric): Secondary | ICD-10-CM | POA: Diagnosis not present

## 2019-11-04 DIAGNOSIS — J309 Allergic rhinitis, unspecified: Secondary | ICD-10-CM | POA: Diagnosis not present

## 2019-11-04 DIAGNOSIS — Z23 Encounter for immunization: Secondary | ICD-10-CM | POA: Diagnosis not present

## 2019-11-04 DIAGNOSIS — K2270 Barrett's esophagus without dysplasia: Secondary | ICD-10-CM | POA: Diagnosis not present

## 2019-11-04 DIAGNOSIS — I10 Essential (primary) hypertension: Secondary | ICD-10-CM | POA: Diagnosis not present

## 2019-11-04 DIAGNOSIS — E113293 Type 2 diabetes mellitus with mild nonproliferative diabetic retinopathy without macular edema, bilateral: Secondary | ICD-10-CM | POA: Diagnosis not present

## 2019-11-24 DIAGNOSIS — G4733 Obstructive sleep apnea (adult) (pediatric): Secondary | ICD-10-CM | POA: Diagnosis not present

## 2020-01-05 DIAGNOSIS — H52223 Regular astigmatism, bilateral: Secondary | ICD-10-CM | POA: Diagnosis not present

## 2020-01-05 DIAGNOSIS — E119 Type 2 diabetes mellitus without complications: Secondary | ICD-10-CM | POA: Diagnosis not present

## 2020-02-26 DIAGNOSIS — G4733 Obstructive sleep apnea (adult) (pediatric): Secondary | ICD-10-CM | POA: Diagnosis not present

## 2020-04-28 DIAGNOSIS — Z136 Encounter for screening for cardiovascular disorders: Secondary | ICD-10-CM | POA: Diagnosis not present

## 2020-04-28 DIAGNOSIS — Z1389 Encounter for screening for other disorder: Secondary | ICD-10-CM | POA: Diagnosis not present

## 2020-04-28 DIAGNOSIS — I4510 Unspecified right bundle-branch block: Secondary | ICD-10-CM | POA: Diagnosis not present

## 2020-04-28 DIAGNOSIS — Z125 Encounter for screening for malignant neoplasm of prostate: Secondary | ICD-10-CM | POA: Diagnosis not present

## 2020-04-28 DIAGNOSIS — J309 Allergic rhinitis, unspecified: Secondary | ICD-10-CM | POA: Diagnosis not present

## 2020-04-28 DIAGNOSIS — Z0000 Encounter for general adult medical examination without abnormal findings: Secondary | ICD-10-CM | POA: Diagnosis not present

## 2020-04-28 DIAGNOSIS — I10 Essential (primary) hypertension: Secondary | ICD-10-CM | POA: Diagnosis not present

## 2020-04-28 DIAGNOSIS — E1169 Type 2 diabetes mellitus with other specified complication: Secondary | ICD-10-CM | POA: Diagnosis not present

## 2020-04-28 DIAGNOSIS — K2270 Barrett's esophagus without dysplasia: Secondary | ICD-10-CM | POA: Diagnosis not present

## 2020-04-28 DIAGNOSIS — Z7984 Long term (current) use of oral hypoglycemic drugs: Secondary | ICD-10-CM | POA: Diagnosis not present

## 2020-04-28 DIAGNOSIS — G4733 Obstructive sleep apnea (adult) (pediatric): Secondary | ICD-10-CM | POA: Diagnosis not present

## 2020-05-12 ENCOUNTER — Encounter: Payer: Self-pay | Admitting: Cardiology

## 2020-05-12 ENCOUNTER — Ambulatory Visit: Payer: PPO | Admitting: Cardiology

## 2020-05-12 ENCOUNTER — Other Ambulatory Visit: Payer: Self-pay

## 2020-05-12 VITALS — BP 137/84 | HR 78 | Resp 16 | Ht 71.0 in | Wt 213.0 lb

## 2020-05-12 DIAGNOSIS — I4510 Unspecified right bundle-branch block: Secondary | ICD-10-CM

## 2020-05-12 NOTE — Progress Notes (Signed)
Patient referred by [name], MD for right bundle branch block.   Subjective:   B Todd Gentry, male    DOB: [birth year], 69 y.o.   MRN: [id number]   Chief Complaint  Patient presents with  . RBBB  . New Patient (Initial Visit)    HPI  69 y.o. caucasian male with history of hypertension, hyperlipidemia, type 2 DM, OSA, GERD, referred for evaluation of abnormal EKG  Patient ran [place], stays active with walking 2 miles daily. He denies chest pain, shortness of breath, palpitations, leg edema, orthopnea, PND, TIA/syncope. EKG reviewed.    Past Medical History:  Diagnosis Date  . Allergic rhinitis, cause unspecified   . Esophageal reflux   . Obstructive sleep apnea (adult) (pediatric)   . Unspecified asthma(493.90)      Past Surgical History:  Procedure Laterality Date  . HEMORRHOID SURGERY    . limited palatoplasty/uvulectomy    . NASAL SEPTOPLASTY W/ TURBINOPLASTY       Social History   Tobacco Use  Smoking Status Former Smoker  . Packs/day: 0.50  . Years: 20.00  . Pack years: 10.00  . Types: Cigarettes  . Quit date: 77  . Years since quitting: 28.9  Smokeless Tobacco Never Used    Social History   Substance and Sexual Activity  Alcohol Use None     Family History  Problem Relation Age of Onset  . Parkinsonism Father   . Allergic rhinitis Mother      Current Outpatient Medications on File Prior to Visit  Medication Sig Dispense Refill  . albuterol (PROAIR HFA) 108 (90 Base) MCG/ACT inhaler Inhale 2 puffs into the lungs 4 (four) times daily as needed for wheezing or shortness of breath. 1 Inhaler prn  . lisinopril-hydrochlorothiazide (ZESTORETIC) 20-12.5 MG tablet     . metFORMIN (GLUCOPHAGE) 500 MG tablet Take 500 mg by mouth every evening.  11   No current facility-administered medications on file prior to visit.    Cardiovascular and other pertinent studies:  EKG 05/12/2020: Sinus rhythm 80 bpm  Incomplete right  bundle branch block   Recent labs: 04/28/2020: Glucose 125, BUN/Cr 16/1.00. EGFR 74. Na/K 139/4.2. Rest of the CMP normal H/H 13.6/41.3. MCV 88.4. Platelets 198 HbA1C 7.2% Chol 137, TG 136, HDL 38, LDL 75    Review of Systems  Cardiovascular: Negative for chest pain, dyspnea on exertion, leg swelling, palpitations and syncope.         Vitals:   05/12/20 1043  BP: 137/84  Pulse: 78  Resp: 16  SpO2: 96%     Body mass index is 29.71 kg/m. Filed Weights   05/12/20 1043  Weight: 213 lb (96.6 kg)     Objective:   Physical Exam Vitals and nursing note reviewed.  Constitutional:      General: He is not in acute distress. Neck:     Vascular: No JVD.  Cardiovascular:     Rate and Rhythm: Normal rate and regular rhythm.     Heart sounds: Normal heart sounds. No murmur heard.   Pulmonary:     Effort: Pulmonary effort is normal.     Breath sounds: Normal breath sounds. No wheezing or rales.          Assessment & Recommendations:   69 y.o. caucasian male with history of hypertension, hyperlipidemia, type 2 DM, OSA, GERD, referred for evaluation of abnormal EKG  Normal physical exam. No symptoms. Will obtain an echocardiogram. Recommend heart healthy diet and exercise.  Thank  you for referring the patient to [place]. Please feel free to contact with any questions.   Nigel Mormon, MD Pager: [phone] Office: [phone]

## 2020-05-16 ENCOUNTER — Ambulatory Visit: Payer: PPO

## 2020-05-16 ENCOUNTER — Other Ambulatory Visit: Payer: Self-pay

## 2020-05-16 DIAGNOSIS — I4510 Unspecified right bundle-branch block: Secondary | ICD-10-CM | POA: Diagnosis not present

## 2020-07-14 ENCOUNTER — Encounter: Payer: Self-pay | Admitting: Internal Medicine

## 2020-07-15 NOTE — Progress Notes (Signed)
HPI male former smoker followed for OSA/UPPP/CPAP, allergic rhinitis, asthma, complicated by GERD NPSG 04/10/1999- AHI 80/ hr, desaturation to 74%, body weight 200 lbs CPAP titrated to 16 -------------------------------------------------------------------------------   07/17/19- 70 year old male former smoker followed for OSA/UPPP/CPAP, allergic rhinitis, asthma, complicated by GERD, prediabetes, HBP CPAP auto 10-20/ [facility]  Body weight 216 lbs Download compliance 100%, AHI 1.7/ hr Doing very well with CPAP. Likes autopap. No problems. Denies other interval health issues or changes to report.  No recent need for rescue inhaler.  07/18/20- 70 year old male former smoker followed for OSA/UPPP/CPAP, allergic rhinitis, Asthma, complicated by GERD/ Barrett's, DM2, HTN, RBBB, Hyperlipidemia,  Proair hfa,  CPAP auto 10-20/ [facility] Download- compliance 100%, AHI 2/ hr Body weight today- 213 lbs Covid vax-3 Phizer Flu vax- had Download reviewed. Doing well with no concerns.  Asthma well-controlled with very rare need for rescue. Triggers include perfumes, dogs.    He says Dr Donette Larry has done CXR, EKG as he reaches Medicare age. BP up on arrival- he will follow with Dr Donette Larry.  ROS-see HPI + = positive Constitutional:   No-   weight loss, night sweats, fevers, chills, fatigue, lassitude. HEENT:   No-  headaches, difficulty swallowing, tooth/dental problems, sore throat,       No-  sneezing, itching, ear ache, nasal congestion, post nasal drip,  CV:  No-   chest pain, orthopnea, PND, swelling in lower extremities, anasarca, dizziness, palpitations Resp: No-   shortness of breath with exertion or at rest.              No-   productive cough,  No non-productive cough,  No- coughing up of blood.              No-   change in color of mucus.  No- wheezing.   Skin: No-   rash or lesions. GI:  No-   heartburn, indigestion, abdominal pain, nausea, vomiting,  GU:  MS:  No acute  . Neuro-     nothing  unusual Psych:  No- change in mood or affect. No depression or anxiety.  No memory loss.  OBJ- Physical Exam   General- Alert, Oriented, Affect-appropriate, Distress- none acute, + overweight Skin- rash-none, lesions- none, excoriation- none Lymphadenopathy- none Head- atraumatic            Eyes- Gross vision intact, PERRLA, conjunctivae and secretions clear            Ears- Hearing, canals-normal            Nose- Clear, no-Septal dev, mucus, polyps, erosion, perforation             Throat- Mallampati III- short uvula , mucosa clear , drainage- none, tonsils- atrophic Neck- flexible , trachea midline, no stridor , thyroid nl, carotid no bruit Chest - symmetrical excursion , unlabored           Heart/CV- RRR , no murmur , no gallop  , no rub, nl s1 s2                           - JVD- none , edema- none, stasis changes- none, varices- none           Lung- clear to P&A, wheeze- none, cough- none , dullness-none, rub- none           Chest wall-  Abd- Br/ Gen/ Rectal- Not done, not indicated Extrem- cyanosis- none, clubbing, none, atrophy- none, strength- nl Neuro- grossly  intact to observation

## 2020-07-18 ENCOUNTER — Other Ambulatory Visit: Payer: Self-pay

## 2020-07-18 ENCOUNTER — Ambulatory Visit: Payer: PPO | Admitting: Internal Medicine

## 2020-07-18 ENCOUNTER — Encounter: Payer: Self-pay | Admitting: Internal Medicine

## 2020-07-18 VITALS — BP 160/78 | HR 66 | Temp 97.7°F | Ht 71.0 in | Wt 213.2 lb

## 2020-07-18 DIAGNOSIS — J4520 Mild intermittent asthma, uncomplicated: Secondary | ICD-10-CM

## 2020-07-18 DIAGNOSIS — G4733 Obstructive sleep apnea (adult) (pediatric): Secondary | ICD-10-CM

## 2020-07-18 MED ORDER — ALBUTEROL SULFATE HFA 108 (90 BASE) MCG/ACT IN AERS: 2.0000 | INHALATION_SPRAY | Freq: Four times a day (QID) | RESPIRATORY_TRACT | 99 refills | Status: DC | PRN

## 2020-07-18 NOTE — Patient Instructions (Signed)
We can  Continue CPAP auto 10-20  Albuterol rescue inhaler refilled  Please call if we can help

## 2020-07-18 NOTE — Assessment & Plan Note (Signed)
Benefits from CPAP with continued good compliance and control Plan- continue auto 10-20 

## 2020-07-18 NOTE — Assessment & Plan Note (Signed)
Very well-controlled. He understands his triggers. Rarely needs rescue inhaler Plan- albuterol hfa refilled

## 2020-07-26 DIAGNOSIS — G4733 Obstructive sleep apnea (adult) (pediatric): Secondary | ICD-10-CM | POA: Diagnosis not present

## 2020-10-21 ENCOUNTER — Ambulatory Visit (HOSPITAL_COMMUNITY)
Admission: EM | Admit: 2020-10-21 | Discharge: 2020-10-21 | Disposition: A | Discharge status: Home / Self Care | Payer: PPO | Attending: Internal Medicine | Admitting: Internal Medicine

## 2020-10-21 ENCOUNTER — Encounter (HOSPITAL_COMMUNITY): Payer: Self-pay | Admitting: Emergency Medicine

## 2020-10-21 ENCOUNTER — Emergency Department (HOSPITAL_COMMUNITY): Payer: PPO

## 2020-10-21 ENCOUNTER — Emergency Department (HOSPITAL_COMMUNITY)
Admission: EM | Admit: 2020-10-21 | Discharge: 2020-10-21 | Disposition: A | Discharge status: Home / Self Care | Payer: PPO | Attending: Emergency Medicine | Admitting: Emergency Medicine

## 2020-10-21 ENCOUNTER — Encounter (HOSPITAL_COMMUNITY): Payer: Self-pay | Admitting: Pharmacy Technician

## 2020-10-21 ENCOUNTER — Other Ambulatory Visit: Payer: Self-pay

## 2020-10-21 DIAGNOSIS — J9811 Atelectasis: Secondary | ICD-10-CM | POA: Diagnosis not present

## 2020-10-21 DIAGNOSIS — Z79899 Other long term (current) drug therapy: Secondary | ICD-10-CM | POA: Insufficient documentation

## 2020-10-21 DIAGNOSIS — R7303 Prediabetes: Secondary | ICD-10-CM | POA: Insufficient documentation

## 2020-10-21 DIAGNOSIS — J4520 Mild intermittent asthma, uncomplicated: Secondary | ICD-10-CM | POA: Diagnosis not present

## 2020-10-21 DIAGNOSIS — R000 Tachycardia, unspecified: Secondary | ICD-10-CM | POA: Insufficient documentation

## 2020-10-21 DIAGNOSIS — Z87891 Personal history of nicotine dependence: Secondary | ICD-10-CM | POA: Diagnosis not present

## 2020-10-21 DIAGNOSIS — Z7984 Long term (current) use of oral hypoglycemic drugs: Secondary | ICD-10-CM | POA: Diagnosis not present

## 2020-10-21 DIAGNOSIS — K219 Gastro-esophageal reflux disease without esophagitis: Secondary | ICD-10-CM | POA: Diagnosis not present

## 2020-10-21 DIAGNOSIS — R079 Chest pain, unspecified: Secondary | ICD-10-CM | POA: Diagnosis not present

## 2020-10-21 DIAGNOSIS — I10 Essential (primary) hypertension: Secondary | ICD-10-CM | POA: Diagnosis not present

## 2020-10-21 DIAGNOSIS — R072 Precordial pain: Secondary | ICD-10-CM | POA: Insufficient documentation

## 2020-10-21 DIAGNOSIS — R0789 Other chest pain: Secondary | ICD-10-CM | POA: Diagnosis not present

## 2020-10-21 LAB — BASIC METABOLIC PANEL
Anion gap: 9 (ref 5–15)
BUN: 12 mg/dL (ref 8–23)
CO2: 25 mmol/L (ref 22–32)
Calcium: 9.4 mg/dL (ref 8.9–10.3)
Chloride: 103 mmol/L (ref 98–111)
Creatinine, Ser: 0.99 mg/dL (ref 0.61–1.24)
GFR, Estimated: >60 mL/min (ref >60)
Glucose, Bld: 148 mg/dL — ABNORMAL HIGH (ref 70–99)
Potassium: 3.8 mmol/L (ref 3.5–5.1)
Sodium: 137 mmol/L (ref 135–145)

## 2020-10-21 LAB — CBC WITH DIFFERENTIAL/PLATELET
Abs Immature Granulocytes: 0.04 10*3/uL (ref 0.00–0.07)
Basophils Absolute: 0.1 10*3/uL (ref 0.0–0.1)
Basophils Relative: 1 %
Eosinophils Absolute: 0.1 10*3/uL (ref 0.0–0.5)
Eosinophils Relative: 1 %
HCT: 41.4 % (ref 39.0–52.0)
Hemoglobin: 13.7 g/dL (ref 13.0–17.0)
Immature Granulocytes: 0 %
Lymphocytes Relative: 16 %
Lymphs Abs: 1.8 10*3/uL (ref 0.7–4.0)
MCH: 29.2 pg (ref 26.0–34.0)
MCHC: 33.1 g/dL (ref 30.0–36.0)
MCV: 88.3 fL (ref 80.0–100.0)
Monocytes Absolute: 1.2 10*3/uL — ABNORMAL HIGH (ref 0.1–1.0)
Monocytes Relative: 11 %
Neutro Abs: 7.7 10*3/uL (ref 1.7–7.7)
Neutrophils Relative %: 71 %
Platelets: 229 10*3/uL (ref 150–400)
RBC: 4.69 MIL/uL (ref 4.22–5.81)
RDW: 12.9 % (ref 11.5–15.5)
WBC: 10.9 10*3/uL — ABNORMAL HIGH (ref 4.0–10.5)
nRBC: 0 % (ref 0.0–0.2)

## 2020-10-21 LAB — TROPONIN I (HIGH SENSITIVITY)
Troponin I (High Sensitivity): 14 ng/L (ref <18)
Troponin I (High Sensitivity): 14 ng/L (ref <18)

## 2020-10-21 MED ORDER — ALUM & MAG HYDROXIDE-SIMETH 200-200-20 MG/5ML PO SUSP
30.0000 mL | Freq: Once | ORAL | Status: AC
  Administered 2020-10-21: 30 mL via ORAL

## 2020-10-21 MED ORDER — ALUM & MAG HYDROXIDE-SIMETH 200-200-20 MG/5ML PO SUSP
ORAL | Status: AC
  Filled 2020-10-21: qty 30

## 2020-10-21 MED ORDER — LIDOCAINE VISCOUS HCL 2 % MT SOLN
15.0000 mL | Freq: Once | OROMUCOSAL | Status: AC
  Administered 2020-10-21: 15 mL via ORAL

## 2020-10-21 MED ORDER — LIDOCAINE VISCOUS HCL 2 % MT SOLN
OROMUCOSAL | Status: AC
  Filled 2020-10-21: qty 15

## 2020-10-21 NOTE — ED Provider Notes (Signed)
Emergency Medicine Provider Triage Evaluation Note  Todd Gentry , a 70 y.o. male  was evaluated in triage.  Pt complains of chest pain that began today.  Reports it will be intermittent pleuritic.  Had COVID 2 weeks ago.  No shortness of breath or leg swelling.  Sent by urgent care.  No improvement with GI cocktail  Review of Systems  Positive: Chest pain Negative: Shortness of breath or leg swelling  Physical Exam  BP (!) 166/87   Pulse (!) 107   Temp 98.8 F (37.1 C) (Oral)   Resp 16   SpO2 98%  Gen:   Awake, no distress   Resp:  Normal effort  MSK:   Moves extremities without difficulty  Other:  No edema noted bilaterally  Medical Decision Making  Medically screening exam initiated at 6:42 PM.  Appropriate orders placed.  WENDY MIKLES was informed that the remainder of the evaluation will be completed by another provider, this initial triage assessment does not replace that evaluation, and the importance of remaining in the ED until their evaluation is complete.  Labs, EKG and chest x-ray were   Dietrich Pates, PA-C 10/21/20 1843    Tegeler, Canary Brim, MD 10/21/20 2036

## 2020-10-21 NOTE — ED Triage Notes (Signed)
PT reports central chest pain that does not radiate, present all the time but is worse with deep breaths. Started today as he was driving to work. Notes that he has been belching today as well.

## 2020-10-21 NOTE — ED Provider Notes (Signed)
[facility]    CSN: [id number] Arrival date & time: 10/21/20  1556      History   Chief Complaint Chief Complaint  Patient presents with  . Chest Pain    Worsened by deep breaths    HPI Todd Gentry is a 70 y.o. male.   Patient presenting today with 1 day history of mild superficial central chest discomfort, constant but worse with deep breaths.  He is also noticed increase in belching since onset.  It started today while he was driving into work and has not relented.  Denies any palpitations, shortness of breath, wheezing, chest pressure, radiation of pain down arm, diaphoresis, headaches, fevers, abdominal pain, nausea vomiting or diarrhea.  He does have a history of GERD, does not take anything for this.  Has not tried anything since onset of symptoms.  Does have a history of an abnormal EKG about a year ago for which she was referred to cardiology who cleared him from a cardiac standpoint at that time.  Has a history of mild intermittent asthma for which she has not required treatment in a long time and states this does not feel like an asthma episode.  Only new event recently as he recovered from COVID 2 weeks ago but states he had a very mild illness and no obvious lasting effects since.      Past Medical History:  Diagnosis Date  . Allergic rhinitis, cause unspecified   . Esophageal reflux   . Obstructive sleep apnea (adult) (pediatric)   . Unspecified asthma(493.90)     Patient Active Problem List   Diagnosis Date Noted  . Incomplete RBBB 05/12/2020  . FACIAL FLUSHING 10/03/2007  . G E R D 07/05/2007  . Obstructive sleep apnea 07/03/2007  . Seasonal and perennial allergic rhinitis 07/03/2007  . Asthma, mild intermittent 07/03/2007    Past Surgical History:  Procedure Laterality Date  . HEMORRHOID SURGERY    . limited palatoplasty/uvulectomy    . NASAL SEPTOPLASTY W/ TURBINOPLASTY         Home Medications    Prior to Admission medications    Medication Sig Start Date End Date Taking? Authorizing Provider  lisinopril-hydrochlorothiazide (ZESTORETIC) 20-12.5 MG tablet Take 1 tablet by mouth daily. 05/06/19  Yes [provider]  metFORMIN (GLUCOPHAGE) 500 MG tablet Take 500 mg by mouth 2 (two) times daily with a meal. 07/04/14  Yes [provider]  omeprazole (PRILOSEC) 20 MG capsule Take 20 mg by mouth daily. 02/19/20  Yes [provider]  simvastatin (ZOCOR) 10 MG tablet Take 10 mg by mouth. Takes 1 tablet three times a week. 04/21/20  Yes [provider]  albuterol (PROAIR HFA) 108 (90 Base) MCG/ACT inhaler Inhale 2 puffs into the lungs 4 (four) times daily as needed for wheezing or shortness of breath. 07/18/20 08/03/25  [name], MD    Family History Family History  Problem Relation Age of Onset  . Parkinsonism Father   . Allergic rhinitis Mother     Social History Social History   Tobacco Use  . Smoking status: Former Smoker    Packs/day: 0.50    Years: 20.00    Pack years: 10.00    Types: Cigarettes    Quit date: 1993    Years since quitting: 29.4  . Smokeless tobacco: Never Used  Substance Use Topics  . Alcohol use: Yes    Comment: occ  . Drug use: Never     Allergies   Patient  has no known allergies.   Review of Systems Review of Systems Per HPI  Physical Exam Triage Vital Signs ED Triage Vitals  Enc Vitals Group     BP 10/21/20 1602 (!) 159/89     Pulse Rate 10/21/20 1602 98     Resp 10/21/20 1602 16     Temp 10/21/20 1602 99 F (37.2 C)     Temp Source 10/21/20 1602 Oral     SpO2 10/21/20 1602 97 %     Weight --      Height --      Head Circumference --      Peak Flow --      Pain Score 10/21/20 1601 3     Pain Loc --      Pain Edu? --      Excl. in GC? --    No data found.  Updated Vital Signs BP (!) 159/89   Pulse 98   Temp 99 F (37.2 C) (Oral)   Resp 16   SpO2 97%   Visual Acuity Right Eye Distance:   Left Eye Distance:    Bilateral Distance:    Right Eye Near:   Left Eye Near:    Bilateral Near:     Physical Exam Vitals and nursing note reviewed.  Constitutional:      Appearance: Normal appearance.  HENT:     Head: Atraumatic.     Mouth/Throat:     Mouth: Mucous membranes are moist.     Pharynx: Oropharynx is clear.  Eyes:     Extraocular Movements: Extraocular movements intact.     Conjunctiva/sclera: Conjunctivae normal.  Cardiovascular:     Rate and Rhythm: Normal rate and regular rhythm.     Pulses: Normal pulses.     Heart sounds: Normal heart sounds.  Pulmonary:     Effort: Pulmonary effort is normal. No respiratory distress.     Breath sounds: Normal breath sounds. No wheezing or rales.  Abdominal:     General: Bowel sounds are normal. There is no distension.     Palpations: Abdomen is soft.     Tenderness: There is no abdominal tenderness. There is no guarding.  Musculoskeletal:        General: No swelling or tenderness. Normal range of motion.     Cervical back: Normal range of motion and neck supple.  Skin:    General: Skin is warm and dry.  Neurological:     General: No focal deficit present.     Mental Status: He is oriented to person, place, and time.  Psychiatric:        Mood and Affect: Mood normal.        Thought Content: Thought content normal.        Judgment: Judgment normal.    UC Treatments / Results  Labs (all labs ordered are listed, but only abnormal results are displayed) Labs Reviewed - No data to display  EKG   Radiology No results found.  Procedures Procedures (including critical care time)  Medications Ordered in UC Medications  alum & mag hydroxide-simeth (MAALOX/MYLANTA) 200-200-20 MG/5ML suspension 30 mL (30 mLs Oral Given 10/21/20 1659)    And  lidocaine (XYLOCAINE) 2 % viscous mouth solution 15 mL (15 mLs Oral Given 10/21/20 1659)    Initial Impression / Assessment and Plan / UC Course  I have reviewed the triage vital signs and the  nursing notes.  Pertinent labs & imaging results that were available during my care of  the patient were reviewed by me and considered in my medical decision making (see chart for details).     Mildly hypertensive in triage but otherwise vital signs and exam very reassuring.  Pain is not reproducible with palpation or range of motion against resistance exercises.  Does have a history of GERD and is belching since onset, possibly an esophageal spasm.  GI cocktail attempted in clinic with no improvement in symptoms.  EKG stable from previous, showing right bundle branch block but normal sinus rhythm and no acute ST or T wave changes.  Discussed with patient that we are unable to fully rule out cardiac causes in the setting and that he would need to go to the emergency department for that.  Given his age and unclear etiology of his chest pain, did recommend that he go for further evaluation.  He will consider this but also may give his symptoms some time and go to the ED if worsening.  Did discuss either way to follow-up with his cardiologist first thing next week to recheck symptoms.  Final Clinical Impressions(s) / UC Diagnoses   Final diagnoses:  Nonspecific chest pain   Discharge Instructions   None    ED Prescriptions    None     PDMP not reviewed this encounter.   Particia Nearing, [place] 10/21/20 1829

## 2020-10-21 NOTE — ED Provider Notes (Signed)
[facility] EMERGENCY DEPARTMENT Provider Note   CSN: [id number] Arrival date & time: 10/21/20  1754     History Chief Complaint  Patient presents with  . Chest Pain    Todd Gentry is a 70 y.o. male.  70 yo M with a chief complaint of chest pain.  Off and on throughout the day.  He fell getting up and walking around actually made it better.  Also improved with belching.  He denies cough congestion or fever.  Denies exertional symptoms.  Went to urgent care and they suggested he come over here for lab testing.  He denies history of MI.  Has a history of hypertension hyperlipidemia is prediabetic.  He denies smoking.  Mom had an MI.  Denies history of PE or DVT denies hemoptysis denies unilateral lower extremity edema denies recent surgery immobilization or hospitalization denies history of cancer denies testosterone use.  The history is provided by the patient.  Chest Pain Pain location:  Substernal area Pain quality: aching   Pain radiates to:  Does not radiate Pain severity:  Moderate Onset quality:  Gradual Duration:  1 day Timing:  Intermittent Progression:  Waxing and waning Chronicity:  New Relieved by:  Nothing Worsened by:  Nothing Ineffective treatments:  None tried Associated symptoms: no abdominal pain, no fever, no headache, no palpitations, no shortness of breath and no vomiting        Past Medical History:  Diagnosis Date  . Allergic rhinitis, cause unspecified   . Esophageal reflux   . Obstructive sleep apnea (adult) (pediatric)   . Unspecified asthma(493.90)     Patient Active Problem List   Diagnosis Date Noted  . Incomplete RBBB 05/12/2020  . FACIAL FLUSHING 10/03/2007  . G E R D 07/05/2007  . Obstructive sleep apnea 07/03/2007  . Seasonal and perennial allergic rhinitis 07/03/2007  . Asthma, mild intermittent 07/03/2007    Past Surgical History:  Procedure Laterality Date  . HEMORRHOID SURGERY    . limited  palatoplasty/uvulectomy    . NASAL SEPTOPLASTY W/ TURBINOPLASTY         Family History  Problem Relation Age of Onset  . Parkinsonism Father   . Allergic rhinitis Mother     Social History   Tobacco Use  . Smoking status: Former Smoker    Packs/day: 0.50    Years: 20.00    Pack years: 10.00    Types: Cigarettes    Quit date: 1993    Years since quitting: 29.4  . Smokeless tobacco: Never Used  Substance Use Topics  . Alcohol use: Yes    Comment: occ  . Drug use: Never    Home Medications Prior to Admission medications   Medication Sig Start Date End Date Taking? Authorizing Provider  albuterol (PROAIR HFA) 108 (90 Base) MCG/ACT inhaler Inhale 2 puffs into the lungs 4 (four) times daily as needed for wheezing or shortness of breath. 07/18/20 08/03/25  Jetty Duhamel D, MD  lisinopril-hydrochlorothiazide (ZESTORETIC) 20-12.5 MG tablet Take 1 tablet by mouth daily. 05/06/19   [provider]  metFORMIN (GLUCOPHAGE) 500 MG tablet Take 500 mg by mouth 2 (two) times daily with a meal. 07/04/14   [provider]  omeprazole (PRILOSEC) 20 MG capsule Take 20 mg by mouth daily. 02/19/20   [provider]  simvastatin (ZOCOR) 10 MG tablet Take 10 mg by mouth. Takes 1 tablet three times a week. 04/21/20   [provider]    Allergies  Patient has no known allergies.  Review of Systems   Review of Systems  Constitutional: Negative for chills and fever.  HENT: Negative for congestion and facial swelling.   Eyes: Negative for discharge and visual disturbance.  Respiratory: Negative for shortness of breath.   Cardiovascular: Positive for chest pain. Negative for palpitations.  Gastrointestinal: Negative for abdominal pain, diarrhea and vomiting.  Musculoskeletal: Negative for arthralgias and myalgias.  Skin: Negative for color change and rash.  Neurological: Negative for tremors, syncope and headaches.  Psychiatric/Behavioral: Negative for  confusion and dysphoric mood.    Physical Exam Updated Vital Signs BP 125/74   Pulse 98   Temp 98.8 F (37.1 C) (Oral)   Resp 20   SpO2 96%   Physical Exam Vitals and nursing note reviewed.  Constitutional:      Appearance: He is well-developed.  HENT:     Head: Normocephalic and atraumatic.  Eyes:     Pupils: Pupils are equal, round, and reactive to light.  Neck:     Vascular: No JVD.  Cardiovascular:     Rate and Rhythm: Normal rate and regular rhythm.     Heart sounds: No murmur heard. No friction rub. No gallop.   Pulmonary:     Effort: No respiratory distress.     Breath sounds: No wheezing.  Abdominal:     General: There is no distension.     Tenderness: There is no guarding or rebound.  Musculoskeletal:        General: Normal range of motion.     Cervical back: Normal range of motion and neck supple.  Skin:    Coloration: Skin is not pale.     Findings: No rash.  Neurological:     Mental Status: He is alert and oriented to person, place, and time.  Psychiatric:        Behavior: Behavior normal.     ED Results / Procedures / Treatments   Labs (all labs ordered are listed, but only abnormal results are displayed) Labs Reviewed  BASIC METABOLIC PANEL - Abnormal; Notable for the following components:      Result Value   Glucose, Bld 148 (*)    All other components within normal limits  CBC WITH DIFFERENTIAL/PLATELET - Abnormal; Notable for the following components:   WBC 10.9 (*)    Monocytes Absolute 1.2 (*)    All other components within normal limits  TROPONIN I (HIGH SENSITIVITY)  TROPONIN I (HIGH SENSITIVITY)    EKG EKG Interpretation  Date/Time:  Friday October 21 2020 18:40:27 EDT Ventricular Rate:  105 PR Interval:  140 QRS Duration: 108 QT Interval:  360 QTC Calculation: 475 R Axis:   -51 Text Interpretation: Sinus tachycardia with Premature supraventricular complexes Pulmonary disease pattern Incomplete right bundle branch block Left  anterior fascicular block Abnormal ECG No significant change since last tracing Confirmed by Melene Plan [phone]) on 10/21/2020 11:02:58 PM   Radiology DG Chest 2 View  Result Date: 10/21/2020 CLINICAL DATA:  Chest pain EXAM: CHEST - 2 VIEW COMPARISON:  March 19, 2008 FINDINGS: There is bibasilar atelectasis. Lungs elsewhere clear. Heart size and pulmonary vascularity are normal. No adenopathy. No pneumothorax. No bone lesions. IMPRESSION: Bibasilar atelectasis. No edema or airspace opacity. Heart size normal. Electronically Signed   By: Bretta Bang III M.D.   On: 10/21/2020 19:12    Procedures Procedures   Medications Ordered in ED Medications - No data to display  ED Course  I have reviewed the triage  vital signs and the nursing notes.  Pertinent labs & imaging results that were available during my care of the patient were reviewed by me and considered in my medical decision making (see chart for details).    MDM Rules/Calculators/A&P                          70 yo M with a chief complaints of chest pain.  This is atypical in nature improves with ambulation and belching.  He has 2 troponins that are negative.  Lab work otherwise unremarkable no significant electrolyte abnormality no significant anemia.  Chest x-ray viewed by me without focal infiltrate or pneumothorax.  EKG is unchanged from prior.  I discussed the results with the patient.  Would like to go home at this time.  We will have him follow-up with his cardiologist in the office.  Trial of H2 blockers.  I feel this presentation is very atypical for pulmonary embolism and no further work-up is needed.  11:37 PM:  I have discussed the diagnosis/risks/treatment options with the patient and family and believe the pt to be eligible for discharge home to follow-up with PCP. We also discussed returning to the ED immediately if new or worsening sx occur. We discussed the sx which are most concerning (e.g., sudden worsening pain,  fever, inability to tolerate by mouth, exertional symptoms) that necessitate immediate return. Medications administered to the patient during their visit and any new prescriptions provided to the patient are listed below.  Medications given during this visit Medications - No data to display   The patient appears reasonably screen and/or stabilized for discharge and I doubt any other medical condition or other [facility] requiring further screening, evaluation, or treatment in the ED at this time prior to discharge.     Final Clinical Impression(s) / ED Diagnoses Final diagnoses:  Nonspecific chest pain    Rx / DC Orders ED Discharge Orders    None       Melene Plan, DO 10/21/20 2337

## 2020-10-21 NOTE — Discharge Instructions (Signed)
Try pepcid or tagamet up to twice a day.  Try to avoid things that may make this worse, most commonly these are spicy foods tomato based products fatty foods chocolate and peppermint.  Alcohol and tobacco can also make this worse.  Return to the emergency department for sudden worsening pain fever or inability to eat or drink.  

## 2020-10-21 NOTE — ED Triage Notes (Signed)
Pt here with reports of chest pain onset today. Pt reports pain worsens with inspiration. Pt denies shob. Recently had covid.

## 2020-10-25 DIAGNOSIS — G4733 Obstructive sleep apnea (adult) (pediatric): Secondary | ICD-10-CM | POA: Diagnosis not present

## 2020-10-27 DIAGNOSIS — I10 Essential (primary) hypertension: Secondary | ICD-10-CM | POA: Diagnosis not present

## 2020-10-27 DIAGNOSIS — G4733 Obstructive sleep apnea (adult) (pediatric): Secondary | ICD-10-CM | POA: Diagnosis not present

## 2020-10-27 DIAGNOSIS — K2270 Barrett's esophagus without dysplasia: Secondary | ICD-10-CM | POA: Diagnosis not present

## 2020-10-27 DIAGNOSIS — K219 Gastro-esophageal reflux disease without esophagitis: Secondary | ICD-10-CM | POA: Diagnosis not present

## 2020-10-27 DIAGNOSIS — E113293 Type 2 diabetes mellitus with mild nonproliferative diabetic retinopathy without macular edema, bilateral: Secondary | ICD-10-CM | POA: Diagnosis not present

## 2020-10-27 DIAGNOSIS — I4510 Unspecified right bundle-branch block: Secondary | ICD-10-CM | POA: Diagnosis not present

## 2020-12-08 DIAGNOSIS — G4733 Obstructive sleep apnea (adult) (pediatric): Secondary | ICD-10-CM | POA: Diagnosis not present

## 2021-01-05 DIAGNOSIS — I10 Essential (primary) hypertension: Secondary | ICD-10-CM | POA: Diagnosis not present

## 2021-01-05 DIAGNOSIS — H52223 Regular astigmatism, bilateral: Secondary | ICD-10-CM | POA: Diagnosis not present

## 2021-01-05 DIAGNOSIS — H2513 Age-related nuclear cataract, bilateral: Secondary | ICD-10-CM | POA: Diagnosis not present

## 2021-01-05 DIAGNOSIS — H35039 Hypertensive retinopathy, unspecified eye: Secondary | ICD-10-CM | POA: Diagnosis not present

## 2021-01-05 DIAGNOSIS — H524 Presbyopia: Secondary | ICD-10-CM | POA: Diagnosis not present

## 2021-01-05 DIAGNOSIS — H5203 Hypermetropia, bilateral: Secondary | ICD-10-CM | POA: Diagnosis not present

## 2021-03-10 DIAGNOSIS — G4733 Obstructive sleep apnea (adult) (pediatric): Secondary | ICD-10-CM | POA: Diagnosis not present

## 2021-05-03 DIAGNOSIS — G4733 Obstructive sleep apnea (adult) (pediatric): Secondary | ICD-10-CM | POA: Diagnosis not present

## 2021-05-03 DIAGNOSIS — I4510 Unspecified right bundle-branch block: Secondary | ICD-10-CM | POA: Diagnosis not present

## 2021-05-03 DIAGNOSIS — K2270 Barrett's esophagus without dysplasia: Secondary | ICD-10-CM | POA: Diagnosis not present

## 2021-05-03 DIAGNOSIS — I10 Essential (primary) hypertension: Secondary | ICD-10-CM | POA: Diagnosis not present

## 2021-05-03 DIAGNOSIS — E113293 Type 2 diabetes mellitus with mild nonproliferative diabetic retinopathy without macular edema, bilateral: Secondary | ICD-10-CM | POA: Diagnosis not present

## 2021-05-03 DIAGNOSIS — Z125 Encounter for screening for malignant neoplasm of prostate: Secondary | ICD-10-CM | POA: Diagnosis not present

## 2021-05-03 DIAGNOSIS — J309 Allergic rhinitis, unspecified: Secondary | ICD-10-CM | POA: Diagnosis not present

## 2021-05-03 DIAGNOSIS — Z0000 Encounter for general adult medical examination without abnormal findings: Secondary | ICD-10-CM | POA: Diagnosis not present

## 2021-05-03 DIAGNOSIS — Z1389 Encounter for screening for other disorder: Secondary | ICD-10-CM | POA: Diagnosis not present

## 2021-06-14 DIAGNOSIS — G4733 Obstructive sleep apnea (adult) (pediatric): Secondary | ICD-10-CM | POA: Diagnosis not present

## 2021-07-15 NOTE — Progress Notes (Signed)
HPI male former smoker followed for OSA/UPPP/CPAP, allergic rhinitis, asthma, complicated by GERD NPSG 04/10/1999- AHI 80/ hr, desaturation to 74%, body weight 200 lbs CPAP titrated to 16 -------------------------------------------------------------------------------   07/18/20- 71 year old male former smoker followed for OSA/UPPP/CPAP, allergic rhinitis, Asthma, complicated by GERD/ Barrett's, DM2, HTN, RBBB, Hyperlipidemia,  Proair hfa,  CPAP auto 10-20/ [facility] Download- compliance 100%, AHI 2/ hr Body weight today- 213 lbs Covid vax-3 Phizer Flu vax- had Download reviewed. Doing well with no concerns.  Asthma well-controlled with very rare need for rescue. Triggers include perfumes, dogs.    He says Dr Lysle Rubens has done CXR, EKG as he reaches Medicare age. BP up on arrival- he will follow with Dr Lysle Rubens.  07/18/21- 71 year old male former smoker followed for OSA/UPPP/CPAP, allergic rhinitis, Asthma, complicated by GERD/ Barrett's, DM2, HTN, RBBB, Hyperlipidemia,  -Proair hfa,  CPAP auto 10-20/ [facility] Download- compliance 100%, AHI 2/ hr Body weight today- 205 lbs Covid vax-4 Phizer Flu vax- had Download reviewed. Doing well with his CPAP. Considering travel CPAP- discussed. He and wife now retired.  Asthma control good. Occasional rescue inhaler.  CXR 10/21/20-  IMPRESSION: Bibasilar atelectasis. No edema or airspace opacity. Heart size normal.  ROS-see HPI + = positive Constitutional:   No-   weight loss, night sweats, fevers, chills, fatigue, lassitude. HEENT:   No-  headaches, difficulty swallowing, tooth/dental problems, sore throat,       No-  sneezing, itching, ear ache, nasal congestion, post nasal drip,  CV:  No-   chest pain, orthopnea, PND, swelling in lower extremities, anasarca, dizziness, palpitations Resp: No-   shortness of breath with exertion or at rest.              No-   productive cough,  No non-productive cough,  No- coughing up of blood.              No-    change in color of mucus.  No- wheezing.   Skin: No-   rash or lesions. GI:  No-   heartburn, indigestion, abdominal pain, nausea, vomiting,  GU:  MS:  No acute  . Neuro-     nothing unusual Psych:  No- change in mood or affect. No depression or anxiety.  No memory loss.  OBJ- Physical Exam   General- Alert, Oriented, Affect-appropriate, Distress- none acute,  + overweight Skin- rash-none, lesions- none, excoriation- none Lymphadenopathy- none Head- atraumatic            Eyes- Gross vision intact, PERRLA, conjunctivae and secretions clear            Ears- Hearing, canals-normal            Nose- Clear, no-Septal dev, mucus, polyps, erosion, perforation             Throat- Mallampati III- short uvula , mucosa clear , drainage- none, tonsils- atrophic Neck- flexible , trachea midline, no stridor , thyroid nl, carotid no bruit Chest - symmetrical excursion , unlabored           Heart/CV- RRR , no murmur , no gallop  , no rub, nl s1 s2                           - JVD- none , edema- none, stasis changes- none, varices- none           Lung- clear to P&A, wheeze- none, cough- none , dullness-none, rub- none  Chest wall-  Abd- Br/ Gen/ Rectal- Not done, not indicated Extrem- cyanosis- none, clubbing, none, atrophy- none, strength- nl Neuro- grossly intact to observation

## 2021-07-18 ENCOUNTER — Other Ambulatory Visit: Payer: Self-pay

## 2021-07-18 ENCOUNTER — Encounter: Payer: Self-pay | Admitting: Internal Medicine

## 2021-07-18 ENCOUNTER — Ambulatory Visit: Payer: PPO | Admitting: Internal Medicine

## 2021-07-18 DIAGNOSIS — J4520 Mild intermittent asthma, uncomplicated: Secondary | ICD-10-CM

## 2021-07-18 DIAGNOSIS — G4733 Obstructive sleep apnea (adult) (pediatric): Secondary | ICD-10-CM | POA: Diagnosis not present

## 2021-07-18 NOTE — Patient Instructions (Signed)
We can continue CPAP auto 10-20 cwp  We discussed travel CPAP machines like AirMini and Transcend, that can be set to the same pressure range s your current machine.  Please call if we can help

## 2021-07-18 NOTE — Assessment & Plan Note (Signed)
Benefits from PAP with good compliance and control. Plan- continue auto 10-20

## 2021-07-18 NOTE — Assessment & Plan Note (Signed)
Mild intermittent uncomplicated No current cocncern

## 2021-09-12 DIAGNOSIS — G4733 Obstructive sleep apnea (adult) (pediatric): Secondary | ICD-10-CM | POA: Diagnosis not present

## 2021-10-31 DIAGNOSIS — K2270 Barrett's esophagus without dysplasia: Secondary | ICD-10-CM | POA: Diagnosis not present

## 2021-10-31 DIAGNOSIS — K219 Gastro-esophageal reflux disease without esophagitis: Secondary | ICD-10-CM | POA: Diagnosis not present

## 2021-10-31 DIAGNOSIS — J309 Allergic rhinitis, unspecified: Secondary | ICD-10-CM | POA: Diagnosis not present

## 2021-10-31 DIAGNOSIS — E113293 Type 2 diabetes mellitus with mild nonproliferative diabetic retinopathy without macular edema, bilateral: Secondary | ICD-10-CM | POA: Diagnosis not present

## 2021-10-31 DIAGNOSIS — G4733 Obstructive sleep apnea (adult) (pediatric): Secondary | ICD-10-CM | POA: Diagnosis not present

## 2021-10-31 DIAGNOSIS — I10 Essential (primary) hypertension: Secondary | ICD-10-CM | POA: Diagnosis not present

## 2021-12-05 ENCOUNTER — Telehealth: Payer: Self-pay | Admitting: Internal Medicine

## 2021-12-05 DIAGNOSIS — G4733 Obstructive sleep apnea (adult) (pediatric): Secondary | ICD-10-CM

## 2021-12-05 NOTE — Telephone Encounter (Signed)
Spoke with the pt  He needs order for CPAP for travel  This was d/w pt at last visit  Order placed  Nothing further needed

## 2021-12-08 ENCOUNTER — Telehealth: Payer: Self-pay | Admitting: Internal Medicine

## 2021-12-08 NOTE — Telephone Encounter (Signed)
CPAP order was just sent over to [facility] today. I called patient and advised him of this and told him that [facility] will process the order and get in touch with him. Nothing further needed

## 2021-12-11 DIAGNOSIS — G4733 Obstructive sleep apnea (adult) (pediatric): Secondary | ICD-10-CM | POA: Diagnosis not present

## 2022-01-03 IMAGING — DX DG CHEST 2V
2 series · 2 of 2 positions shown · non-contrast
Comparison: March 19, 2008

CLINICAL DATA: Chest pain

EXAM:
CHEST - 2 VIEW

[chest pa]
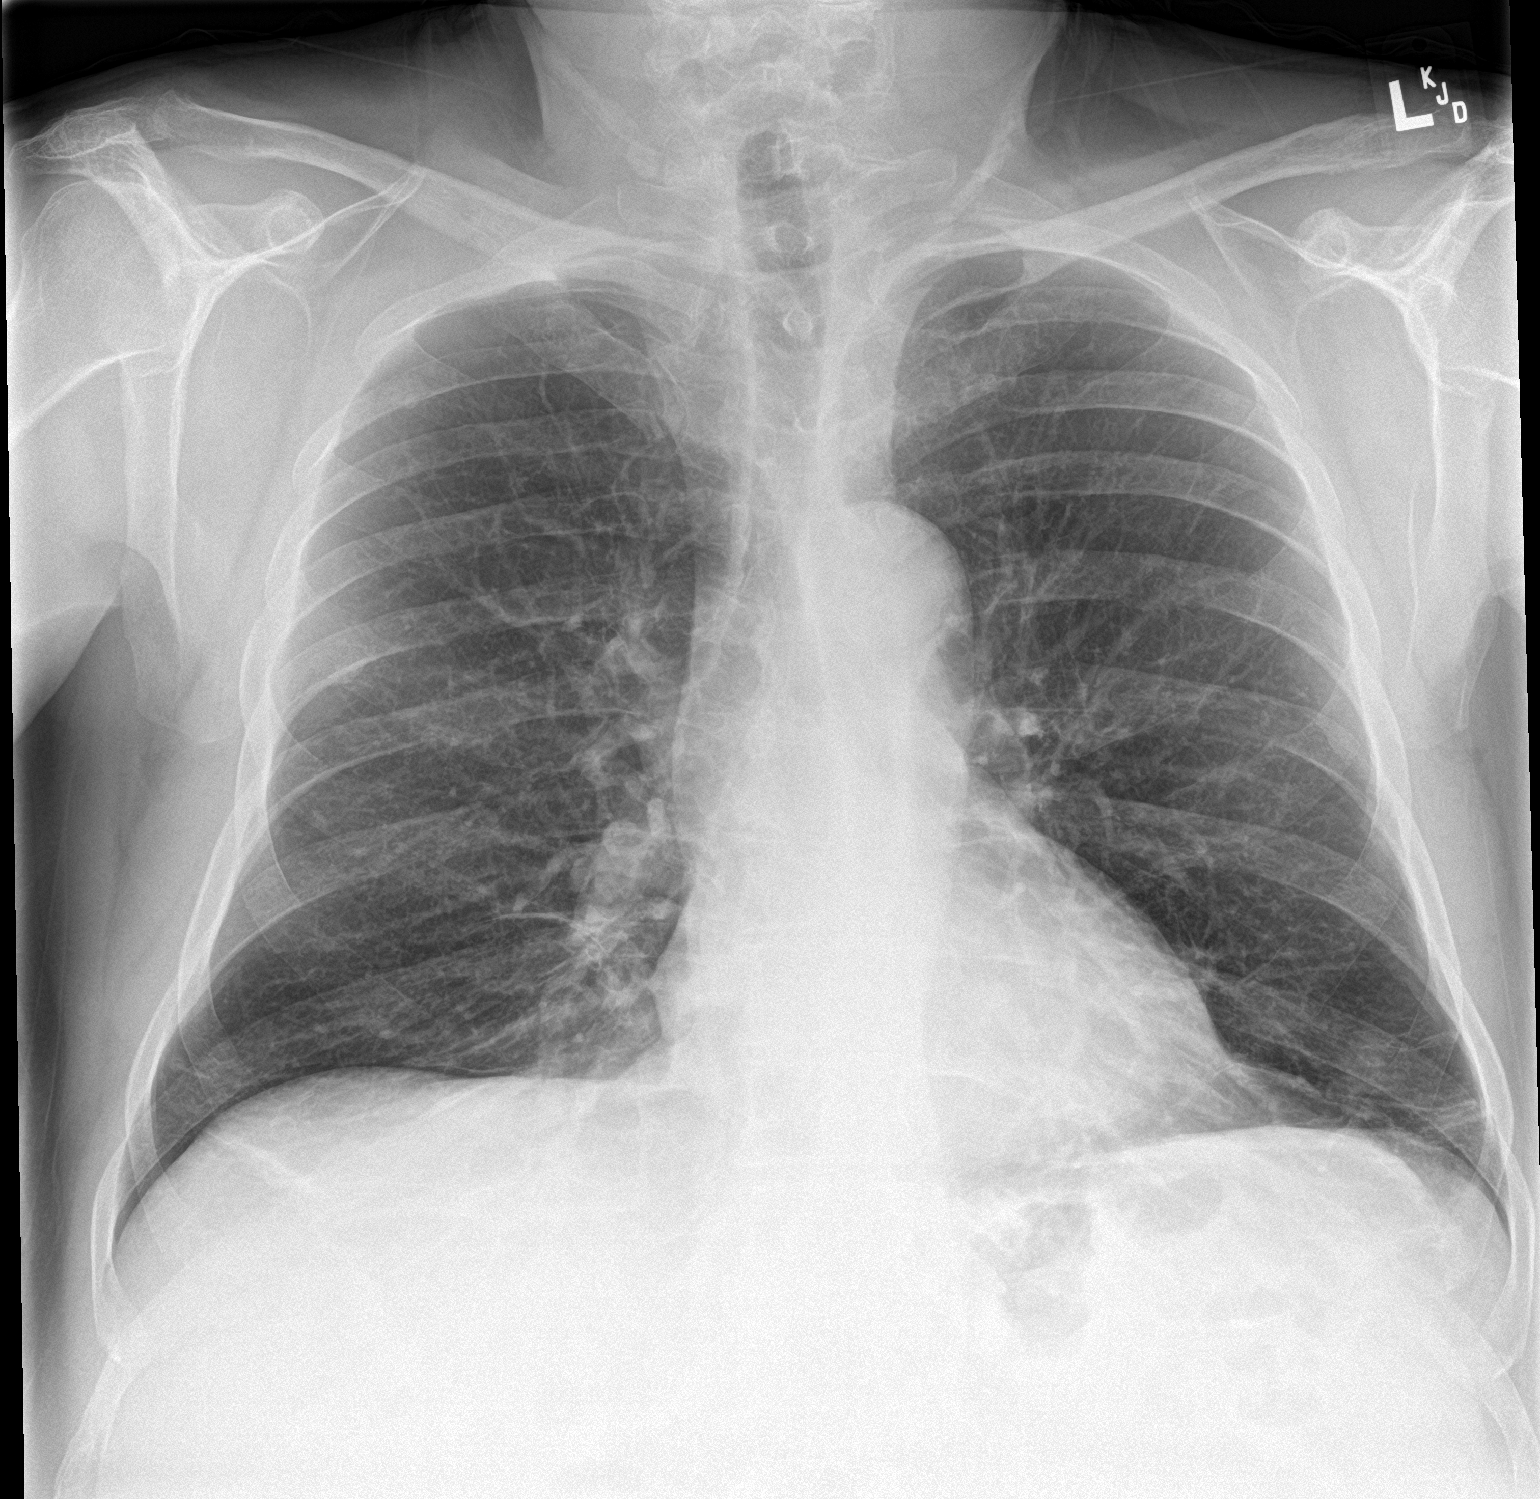

[chest lat]
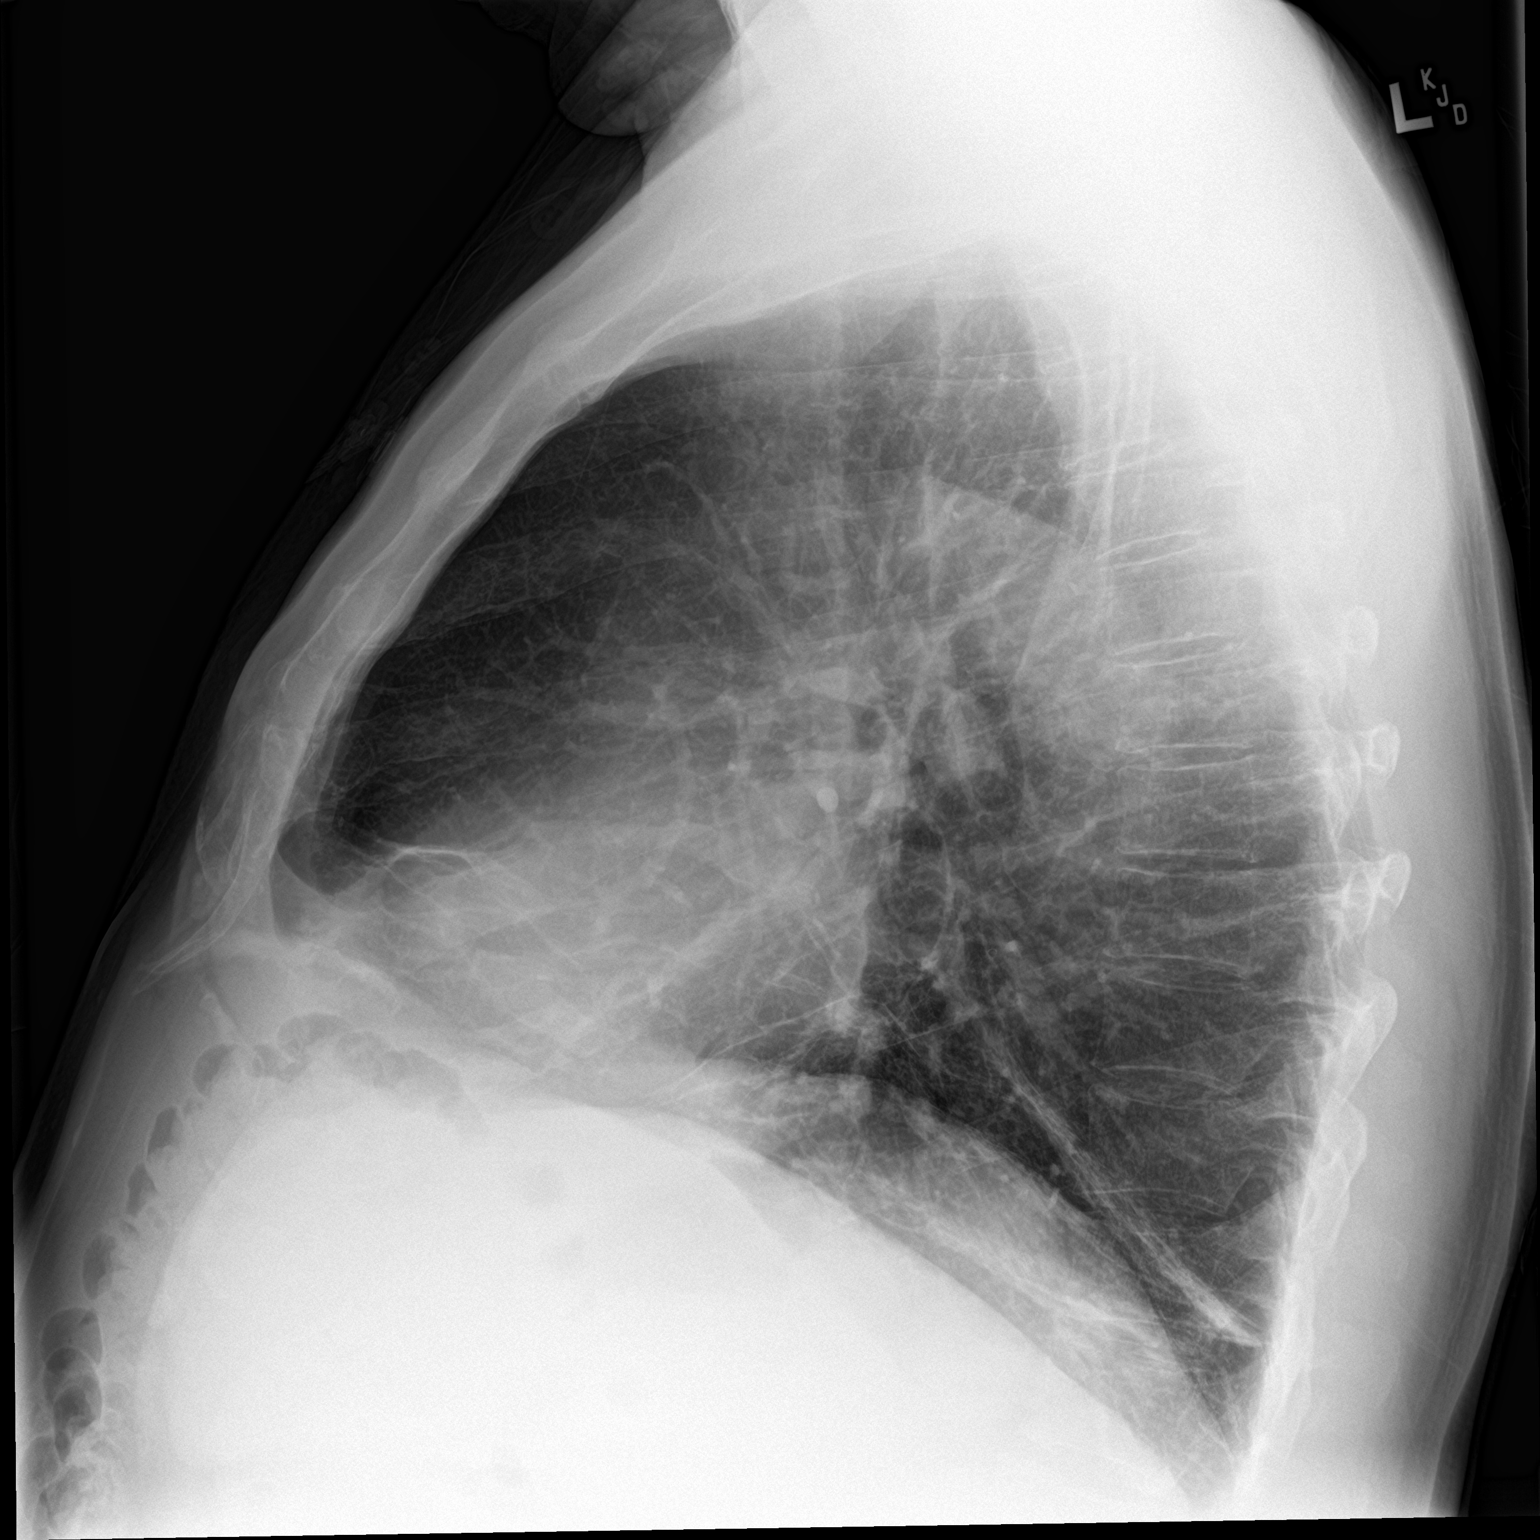

[2 of 2 positions shown; findings below may reference images not displayed]

FINDINGS: There is bibasilar atelectasis. Lungs elsewhere clear. Heart size
and pulmonary vascularity are normal. No adenopathy. No
pneumothorax. No bone lesions.
IMPRESSION: Bibasilar atelectasis. No edema or airspace opacity. Heart size
normal.

## 2022-01-13 DIAGNOSIS — G4733 Obstructive sleep apnea (adult) (pediatric): Secondary | ICD-10-CM | POA: Diagnosis not present

## 2022-01-30 DIAGNOSIS — H52223 Regular astigmatism, bilateral: Secondary | ICD-10-CM | POA: Diagnosis not present

## 2022-01-30 DIAGNOSIS — H5203 Hypermetropia, bilateral: Secondary | ICD-10-CM | POA: Diagnosis not present

## 2022-01-30 DIAGNOSIS — H524 Presbyopia: Secondary | ICD-10-CM | POA: Diagnosis not present

## 2022-01-30 DIAGNOSIS — H33303 Unspecified retinal break, bilateral: Secondary | ICD-10-CM | POA: Diagnosis not present

## 2022-01-30 DIAGNOSIS — E119 Type 2 diabetes mellitus without complications: Secondary | ICD-10-CM | POA: Diagnosis not present

## 2022-01-30 DIAGNOSIS — H2513 Age-related nuclear cataract, bilateral: Secondary | ICD-10-CM | POA: Diagnosis not present

## 2022-01-30 DIAGNOSIS — I10 Essential (primary) hypertension: Secondary | ICD-10-CM | POA: Diagnosis not present

## 2022-01-30 DIAGNOSIS — H35033 Hypertensive retinopathy, bilateral: Secondary | ICD-10-CM | POA: Diagnosis not present

## 2022-01-30 DIAGNOSIS — H35363 Drusen (degenerative) of macula, bilateral: Secondary | ICD-10-CM | POA: Diagnosis not present

## 2022-03-21 DIAGNOSIS — K136 Irritative hyperplasia of oral mucosa: Secondary | ICD-10-CM | POA: Diagnosis not present

## 2022-04-13 DIAGNOSIS — G4733 Obstructive sleep apnea (adult) (pediatric): Secondary | ICD-10-CM | POA: Diagnosis not present

## 2022-05-28 DIAGNOSIS — L03019 Cellulitis of unspecified finger: Secondary | ICD-10-CM | POA: Diagnosis not present

## 2022-05-28 DIAGNOSIS — Z1331 Encounter for screening for depression: Secondary | ICD-10-CM | POA: Diagnosis not present

## 2022-05-28 DIAGNOSIS — I4510 Unspecified right bundle-branch block: Secondary | ICD-10-CM | POA: Diagnosis not present

## 2022-05-28 DIAGNOSIS — J309 Allergic rhinitis, unspecified: Secondary | ICD-10-CM | POA: Diagnosis not present

## 2022-05-28 DIAGNOSIS — K2270 Barrett's esophagus without dysplasia: Secondary | ICD-10-CM | POA: Diagnosis not present

## 2022-05-28 DIAGNOSIS — I10 Essential (primary) hypertension: Secondary | ICD-10-CM | POA: Diagnosis not present

## 2022-05-28 DIAGNOSIS — B351 Tinea unguium: Secondary | ICD-10-CM | POA: Diagnosis not present

## 2022-05-28 DIAGNOSIS — G4733 Obstructive sleep apnea (adult) (pediatric): Secondary | ICD-10-CM | POA: Diagnosis not present

## 2022-05-28 DIAGNOSIS — Z125 Encounter for screening for malignant neoplasm of prostate: Secondary | ICD-10-CM | POA: Diagnosis not present

## 2022-05-28 DIAGNOSIS — Z0000 Encounter for general adult medical examination without abnormal findings: Secondary | ICD-10-CM | POA: Diagnosis not present

## 2022-05-28 DIAGNOSIS — E1169 Type 2 diabetes mellitus with other specified complication: Secondary | ICD-10-CM | POA: Diagnosis not present

## 2022-06-01 ENCOUNTER — Telehealth: Payer: Self-pay | Admitting: Podiatry

## 2022-06-01 ENCOUNTER — Ambulatory Visit (INDEPENDENT_AMBULATORY_CARE_PROVIDER_SITE_OTHER): Payer: PPO | Admitting: Podiatry

## 2022-06-01 DIAGNOSIS — L600 Ingrowing nail: Secondary | ICD-10-CM

## 2022-06-01 NOTE — Telephone Encounter (Signed)
Spoke with patient. No rx needed to be sent to pharmacy. - Dr. Amalia Hailey

## 2022-06-01 NOTE — Telephone Encounter (Signed)
Pt called to inquire about his Rx's; he stated that they have not been received at the pharmacy. Please advise.

## 2022-06-01 NOTE — Progress Notes (Signed)
   Chief Complaint  Patient presents with   Ingrown Toenail    Left foot hallux redness, possible infection and ingrown toenail, started 6 months ago, TX: Doxycyline     HPI: 72 y.o. male presenting today as a new patient for evaluation of redness with swelling around his left great toenail.  Patient states that around Christmas time he stubbed his toe when taking down Christmas tree lights.  Recently he began to notice redness with swelling around the toenail plate.  He was prescribed doxycycline by his PCP x 10 days.  He began this prescription on 05/29/2022.  Presenting for follow-up treatment evaluation  Past Medical History:  Diagnosis Date   Allergic rhinitis, cause unspecified    Esophageal reflux    Obstructive sleep apnea (adult) (pediatric)    Unspecified asthma(493.90)     Past Surgical History:  Procedure Laterality Date   HEMORRHOID SURGERY     limited palatoplasty/uvulectomy     NASAL SEPTOPLASTY W/ TURBINOPLASTY      No Known Allergies   Physical Exam: General: The patient is alert and oriented x3 in no acute distress.  Dermatology: Loosely adhered hyperkeratotic dystrophic discolored nail noted to the left hallux nail plate.  There is some erythema around the nail  Vascular: Palpable pedal pulses bilaterally. Capillary refill within normal limits.  No edema   Neurological: Light touch and protective threshold grossly intact  Musculoskeletal Exam: No pedal deformities noted  Assessment: 1.  Injury left hallux nail plate -Patient evaluated. -After evaluating the toe and discussing with the patient I do believe it is appropriate to totally remove the nail plate and allow new nail to grow in.  It is loosely adhered with surrounding erythema.  The procedure was explained in detail and the patient verbally consented for the procedure. -The toe was prepped in aseptic manner and digital block performed using 3 mL 2% lidocaine plain -Total temporary nail avulsion was  performed in the standard manner.  Dressings applied.  Post care instructions provided -Continue oral doxycycline until completed -Return to clinic 2 weeks for follow-up      Edrick Kins, DPM [facility]  Dr. Edrick Kins, DPM    [address], [place] [address]                Office [phone]  Fax [phone]

## 2022-06-15 ENCOUNTER — Ambulatory Visit (INDEPENDENT_AMBULATORY_CARE_PROVIDER_SITE_OTHER): Payer: PPO | Admitting: Podiatry

## 2022-06-15 VITALS — BP 141/81 | HR 65

## 2022-06-15 DIAGNOSIS — L600 Ingrowing nail: Secondary | ICD-10-CM | POA: Diagnosis not present

## 2022-06-15 NOTE — Progress Notes (Signed)
   Chief Complaint  Patient presents with   Ingrown Toenail    Left foot hallux ingrown toenail follow-up     Subjective: 72 y.o. male presents today status post total nail avulsion procedure of the left hallux nail plate performed on 06/20/8655.  Patient states that he is doing well.  He has been applying the triple antibiotic ointment and a Band-Aid as instructed.  No new complaints this time   Past Medical History:  Diagnosis Date   Allergic rhinitis, cause unspecified    Esophageal reflux    Obstructive sleep apnea (adult) (pediatric)    Unspecified asthma(493.90)     Objective: Neurovascular status intact.  Skin is warm, dry and supple.  Nailbed and soft tissue around the area appears to be healing appropriately  Assessment: #1 s/p total temporary nail avulsion left hallux nail plate   Plan of care: #1 patient was evaluated  #2 light debridement of the periungual debris was performed to the border of the respective toe and nail plate using a tissue nipper. #3 patient is to return to clinic on a PRN basis.   Edrick Kins, DPM [facility]  Dr. Edrick Kins, DPM    [address], [place] [address]                Office [phone]  Fax [phone]

## 2022-07-12 ENCOUNTER — Telehealth: Payer: Self-pay | Admitting: *Deleted

## 2022-07-12 ENCOUNTER — Encounter: Payer: Self-pay | Admitting: Podiatry

## 2022-07-12 DIAGNOSIS — G4733 Obstructive sleep apnea (adult) (pediatric): Secondary | ICD-10-CM | POA: Diagnosis not present

## 2022-07-12 NOTE — Telephone Encounter (Signed)
Patient is calling because his great and 2nd toe has developed blisters , red bumps,please schedule for appointment.

## 2022-07-16 ENCOUNTER — Ambulatory Visit (INDEPENDENT_AMBULATORY_CARE_PROVIDER_SITE_OTHER): Payer: HMO | Admitting: Podiatry

## 2022-07-16 DIAGNOSIS — L258 Unspecified contact dermatitis due to other agents: Secondary | ICD-10-CM

## 2022-07-16 MED ORDER — DOXYCYCLINE HYCLATE 100 MG PO TABS: 100.0000 mg | ORAL_TABLET | Freq: Two times a day (BID) | ORAL | 0 refills | Status: AC

## 2022-07-16 NOTE — Progress Notes (Signed)
   Chief Complaint  Patient presents with   Ingrown Toenail    Rm 8 Nial check along with reaction. Pt states over the weekend he starting to get a rash with fluid filled blisters that are spreading to the top of the foot. No itching or pain. Pt states he has been using Vaseline and neosporin that helps some.     HPI: 72 y.o. male presenting today for follow-up evaluation after total temporary nail avulsion of the left hallux nail plate that was performed on 06/01/2022.  Patient states that subsequently after avulsion of the nail plate he began to notice red bumps and ultimately red irritated skin around the great toe and second toe.  He has been applying Neosporin and Vaseline to the area with no improvement and the condition has actually worsened.  He presents for follow-up treatment and evaluation  Past Medical History:  Diagnosis Date   Allergic rhinitis, cause unspecified    Esophageal reflux    Obstructive sleep apnea (adult) (pediatric)    Unspecified asthma(493.90)     Past Surgical History:  Procedure Laterality Date   HEMORRHOID SURGERY     limited palatoplasty/uvulectomy     NASAL SEPTOPLASTY W/ TURBINOPLASTY      No Known Allergies   LT foot 07/16/2022  LT foot 07/16/2022   Physical Exam: General: The patient is alert and oriented x3 in no acute distress.  Dermatology: Dermatitis with erythema noted around the left great toe and second toe as well as interdigital webspace.  No open wound noted.  There is some maceration noted at the base of the interdigital webspace  Vascular: Palpable pedal pulses bilaterally. Capillary refill within normal limits.  Negative for any significant edema or erythema  Neurological: Light touch and protective threshold grossly intact  Musculoskeletal Exam: No pedal deformities noted  Assessment: 1.  Suspicious for contact dermatitis left -Patient evaluated -I do believe the patient is suffering from a contact type dermatitis likely  from the Neosporin that he has been applying topically.  Discontinue both Neosporin and Vaseline -Recommend not applying any topical ointment to the foot and simply washing with soap and water and letting it air dry -Recommend clean cotton socks daily -I did call in a refill of the oral doxycycline 100 mg 2 times daily #20 -Return to clinic 2 weeks      Edrick Kins, DPM [facility]  Dr. Edrick Kins, DPM    [address], [place] [address]                Office [phone]  Fax [phone]

## 2022-07-18 NOTE — Progress Notes (Signed)
HPI male former smoker followed for OSA/UPPP/CPAP, allergic rhinitis, asthma, complicated by GERD NPSG 04/10/1999- AHI 80/ hr, desaturation to 74%, body weight 200 lbs CPAP titrated to 16 -------------------------------------------------------------------------------  07/18/21- 72 year old male former smoker followed for OSA/UPPP/CPAP, allergic rhinitis, Asthma, complicated by GERD/ Barrett's, DM2, HTN, RBBB, Hyperlipidemia,  -Proair hfa,  CPAP auto 10-20/ [facility] Download- compliance 100%, AHI 2/ hr Body weight today- 205 lbs Covid vax-4 Phizer Flu vax- had Download reviewed. Doing well with his CPAP. Considering travel CPAP- discussed. He and wife now retired.  Asthma control good. Occasional rescue inhaler.  CXR 10/21/20-  IMPRESSION: Bibasilar atelectasis. No edema or airspace opacity. Heart size normal.  07/19/22- 72 year old male former smoker followed for OSA/UPPP/CPAP, allergic rhinitis, Asthma, complicated by GERD/ Barrett's, DM2, HTN, RBBB, Hyperlipidemia,  -Proair hfa,  CPAP auto 10-20/ [facility] Download- compliance-100%, AHI 1.3/ hr Body weight today- 206 lbs Covid vax-4 Phizer Flu vax-had Download reviewed.  He reports doing very well.  Sleeps better with CPAP.  Also has a travel many machine. Minimal asthma.  Has a rescue inhaler he rarely needs-time for refill.  ROS-see HPI + = positive Constitutional:   No-   weight loss, night sweats, fevers, chills, fatigue, lassitude. HEENT:   No-  headaches, difficulty swallowing, tooth/dental problems, sore throat,       No-  sneezing, itching, ear ache, nasal congestion, post nasal drip,  CV:  No-   chest pain, orthopnea, PND, swelling in lower extremities, anasarca, dizziness, palpitations Resp: No-   shortness of breath with exertion or at rest.              No-   productive cough,  No non-productive cough,  No- coughing up of blood.              No-   change in color of mucus.  No- wheezing.   Skin: No-   rash or lesions. GI:   No-   heartburn, indigestion, abdominal pain, nausea, vomiting,  GU:  MS:  No acute  . Neuro-     nothing unusual Psych:  No- change in mood or affect. No depression or anxiety.  No memory loss.  OBJ- Physical Exam   General- Alert, Oriented, Affect-appropriate, Distress- none acute,  Skin- rash-none, lesions- none, excoriation- none Lymphadenopathy- none Head- atraumatic            Eyes- Gross vision intact, PERRLA, conjunctivae and secretions clear            Ears- Hearing, canals-normal            Nose- Clear, no-Septal dev, mucus, polyps, erosion, perforation             Throat- Mallampati III- short uvula , mucosa clear , drainage- none, tonsils- atrophic Neck- flexible , trachea midline, no stridor , thyroid nl, carotid no bruit Chest - symmetrical excursion , unlabored           Heart/CV- RRR , no murmur , no gallop  , no rub, nl s1 s2                           - JVD- none , edema- none, stasis changes- none, varices- none           Lung- clear to P&A, wheeze- none, cough- none , dullness-none, rub- none           Chest wall-  Abd- Br/ Gen/ Rectal- Not done, not indicated Extrem- cyanosis- none,  clubbing, none, atrophy- none, strength- nl Neuro- grossly intact to observation

## 2022-07-19 ENCOUNTER — Encounter: Payer: Self-pay | Admitting: Internal Medicine

## 2022-07-19 ENCOUNTER — Ambulatory Visit (INDEPENDENT_AMBULATORY_CARE_PROVIDER_SITE_OTHER): Payer: HMO | Admitting: Internal Medicine

## 2022-07-19 VITALS — BP 114/72 | HR 67 | Ht 71.0 in | Wt 206.4 lb

## 2022-07-19 DIAGNOSIS — J4520 Mild intermittent asthma, uncomplicated: Secondary | ICD-10-CM

## 2022-07-19 DIAGNOSIS — G4733 Obstructive sleep apnea (adult) (pediatric): Secondary | ICD-10-CM | POA: Diagnosis not present

## 2022-07-19 MED ORDER — ALBUTEROL SULFATE HFA 108 (90 BASE) MCG/ACT IN AERS: 2.0000 | INHALATION_SPRAY | Freq: Four times a day (QID) | RESPIRATORY_TRACT | 99 refills | Status: DC | PRN

## 2022-07-19 NOTE — Patient Instructions (Signed)
Glad you are doing well  We can continue CPAP auto 10-20  Refill sent for albuterol rescue inhaler

## 2022-07-19 NOTE — Assessment & Plan Note (Signed)
Benefits from CPAP with good compliance and control Plan- continue auto 10-20 

## 2022-07-19 NOTE — Assessment & Plan Note (Signed)
Very occasional use of his rescue inhaler has been sufficient.  Discussed use. Plan-refill albuterol HFA

## 2022-07-30 ENCOUNTER — Ambulatory Visit (INDEPENDENT_AMBULATORY_CARE_PROVIDER_SITE_OTHER): Payer: HMO | Admitting: Podiatry

## 2022-07-30 DIAGNOSIS — L258 Unspecified contact dermatitis due to other agents: Secondary | ICD-10-CM

## 2022-07-30 MED ORDER — MUPIROCIN 2 % EX OINT: 1.0000 | TOPICAL_OINTMENT | Freq: Two times a day (BID) | CUTANEOUS | 1 refills | Status: AC

## 2022-07-30 NOTE — Progress Notes (Signed)
   Chief Complaint  Patient presents with   Foot Problem    Left foot blister follow-up, no drainage, doing better,    HPI: 72 y.o. male presenting today for follow-up evaluation after total temporary nail avulsion of the left hallux nail plate that was performed on 06/01/2022.  Since last visit there has been significant improvement in reduction of the erythema around the irritated skin.  He has not been applying anything to the area.  Past Medical History:  Diagnosis Date   Allergic rhinitis, cause unspecified    Esophageal reflux    Obstructive sleep apnea (adult) (pediatric)    Unspecified asthma(493.90)     Past Surgical History:  Procedure Laterality Date   HEMORRHOID SURGERY     limited palatoplasty/uvulectomy     NASAL SEPTOPLASTY W/ TURBINOPLASTY      No Known Allergies   LT foot 07/16/2022  LT foot 07/16/2022  LT foot 07/30/2022   Physical Exam: General: The patient is alert and oriented x3 in no acute distress.  Dermatology: Dermatitis with erythema noted around the left great toe and second toe as well as interdigital webspace seems to be improved.  Fissuring of skin noted with superficial wounds to the base of the 1st interdigital space.  No maceration  Vascular: Palpable pedal pulses bilaterally. Capillary refill within normal limits.  Negative for any significant edema or erythema  Neurological: Light touch and protective threshold grossly intact  Musculoskeletal Exam: No pedal deformities noted  Assessment: 1.  Suspicious for contact dermatitis left -Patient evaluated -Rx Mupirocin 2% ointment BID -No adhesives.  Recommend clean cotton sock daily -I do believe the superficial breakdown of skin should heal uneventfully.  Return to clinic as needed      Edrick Kins, DPM [facility]  Dr. Edrick Kins, DPM    [address], [place] [address]                Office [phone]  Fax  [phone]

## 2022-09-10 ENCOUNTER — Ambulatory Visit (HOSPITAL_COMMUNITY)
Admission: EM | Admit: 2022-09-10 | Discharge: 2022-09-10 | Disposition: A | Discharge status: Home / Self Care | Payer: HMO | Attending: Family Medicine | Admitting: Family Medicine

## 2022-09-10 DIAGNOSIS — M79671 Pain in right foot: Secondary | ICD-10-CM

## 2022-09-10 DIAGNOSIS — M109 Gout, unspecified: Secondary | ICD-10-CM

## 2022-09-10 MED ORDER — COLCHICINE 0.6 MG PO TABS: ORAL_TABLET | ORAL | 0 refills | Status: AC

## 2022-09-10 MED ORDER — INDOMETHACIN 50 MG PO CAPS: 50.0000 mg | ORAL_CAPSULE | Freq: Three times a day (TID) | ORAL | 0 refills | Status: AC

## 2022-09-10 NOTE — ED Triage Notes (Signed)
Pt complains of right foot pain x 2 days. Pt thinks he has gout in his foot.

## 2022-09-10 NOTE — ED Provider Notes (Signed)
[facility]   [id number] 09/10/22 Arrival Time: 0827  ASSESSMENT & PLAN:  1. Right foot pain   2. Podagra     Discharge Medication List as of 09/10/2022 10:25 AM     START taking these medications   Details  colchicine 0.6 MG tablet Take two tablets as one dose followed one hour later by one tablet., Normal    indomethacin (INDOCIN) 50 MG capsule Take 1 capsule (50 mg total) by mouth 3 (three) times daily with meals., Starting Mon 09/10/2022, Normal       Work/school excuse note: not needed. Recommend:  Follow-up Information     Georgann Housekeeper, MD.   Specialty: Internal Medicine Why: If worsening or failing to improve as anticipated. Contact information: [address] [place] [address] [phone]                 Reviewed expectations re: course of current medical issues. Questions answered. Outlined signs and symptoms indicating need for more acute intervention. Patient verbalized understanding. After Visit Summary given.  SUBJECTIVE: History from: patient. Todd Gentry is a 72 y.o. male with h/o gout feels he has gout flare; R foot, spec over great toe. Denies injury/trauma. Unknown trigger. Last gout episode 1-2 years ago. Able to bear weight on RLE. No extremity sensation changes or weakness.  No tx PTA.  Past Surgical History:  Procedure Laterality Date   HEMORRHOID SURGERY     limited palatoplasty/uvulectomy     NASAL SEPTOPLASTY W/ TURBINOPLASTY       OBJECTIVE:  Vitals:   09/10/22 1004  BP: (!) 148/65  Pulse: 82  Resp: 18  Temp: 97.8 F (36.6 C)  SpO2: 98%    General appearance: alert; no distress HEENT: [place]; AT Neck: supple with FROM Resp: unlabored respirations Extremities: RLE: warm with well perfused appearance; well localized marked tenderness over right first MTP joint; with overlying swelling/erythema CV: brisk extremity capillary refill of RLE; 2+ DP pulse of RLE. Skin: warm and dry; no  visible rashes Neurologic: gait normal; normal sensation and strength of RLE Psychological: alert and cooperative; normal mood and affect  No Known Allergies  Past Medical History:  Diagnosis Date   Allergic rhinitis, cause unspecified    Esophageal reflux    Obstructive sleep apnea (adult) (pediatric)    Unspecified asthma(493.90)    Social History   Socioeconomic History   Marital status: Married    Spouse name: Not on file   Number of children: 1   Years of education: Not on file   Highest education level: Not on file  Occupational History   Not on file  Tobacco Use   Smoking status: Former    Packs/day: 0.50    Years: 20.00    Additional pack years: 0.00    Total pack years: 10.00    Types: Cigarettes    Quit date: 57    Years since quitting: 31.3   Smokeless tobacco: Never  Vaping Use   Vaping Use: Never used  Substance and Sexual Activity   Alcohol use: Yes    Comment: occ   Drug use: Never   Sexual activity: Not on file  Other Topics Concern   Not on file  Social History Narrative   Not on file   Social Determinants of Health   Financial Resource Strain: Not on file  Food Insecurity: Not on file  Transportation Needs: Not on file  Physical Activity: Not on file  Stress: Not on file  Social Connections: Not on file   Family History  Problem Relation Age of Onset   Parkinsonism Father    Allergic rhinitis Mother    Past Surgical History:  Procedure Laterality Date   HEMORRHOID SURGERY     limited palatoplasty/uvulectomy     NASAL SEPTOPLASTY W/ TURBINOPLASTY         Mardella Layman, MD 09/10/22 1144

## 2022-10-10 DIAGNOSIS — G4733 Obstructive sleep apnea (adult) (pediatric): Secondary | ICD-10-CM | POA: Diagnosis not present

## 2022-11-28 DIAGNOSIS — E1136 Type 2 diabetes mellitus with diabetic cataract: Secondary | ICD-10-CM | POA: Diagnosis not present

## 2022-11-28 DIAGNOSIS — G4733 Obstructive sleep apnea (adult) (pediatric): Secondary | ICD-10-CM | POA: Diagnosis not present

## 2022-11-28 DIAGNOSIS — K2270 Barrett's esophagus without dysplasia: Secondary | ICD-10-CM | POA: Diagnosis not present

## 2022-11-28 DIAGNOSIS — I10 Essential (primary) hypertension: Secondary | ICD-10-CM | POA: Diagnosis not present

## 2022-11-28 DIAGNOSIS — E113293 Type 2 diabetes mellitus with mild nonproliferative diabetic retinopathy without macular edema, bilateral: Secondary | ICD-10-CM | POA: Diagnosis not present

## 2022-11-28 DIAGNOSIS — M545 Low back pain, unspecified: Secondary | ICD-10-CM | POA: Diagnosis not present

## 2023-01-08 DIAGNOSIS — G4733 Obstructive sleep apnea (adult) (pediatric): Secondary | ICD-10-CM | POA: Diagnosis not present

## 2023-01-31 DIAGNOSIS — I10 Essential (primary) hypertension: Secondary | ICD-10-CM | POA: Diagnosis not present

## 2023-01-31 DIAGNOSIS — E119 Type 2 diabetes mellitus without complications: Secondary | ICD-10-CM | POA: Diagnosis not present

## 2023-01-31 DIAGNOSIS — H35033 Hypertensive retinopathy, bilateral: Secondary | ICD-10-CM | POA: Diagnosis not present

## 2023-01-31 DIAGNOSIS — H2513 Age-related nuclear cataract, bilateral: Secondary | ICD-10-CM | POA: Diagnosis not present

## 2023-01-31 DIAGNOSIS — H53143 Visual discomfort, bilateral: Secondary | ICD-10-CM | POA: Diagnosis not present

## 2023-02-07 DIAGNOSIS — M109 Gout, unspecified: Secondary | ICD-10-CM | POA: Diagnosis not present

## 2023-07-18 DIAGNOSIS — D485 Neoplasm of uncertain behavior of skin: Secondary | ICD-10-CM | POA: Diagnosis not present

## 2023-07-20 NOTE — Progress Notes (Unsigned)
 HPI male former smoker followed for OSA/UPPP/CPAP, allergic rhinitis, asthma, complicated by GERD NPSG 04/10/1999- AHI 80/ hr, desaturation to 74%, body weight 200 lbs CPAP titrated to 16 -------------------------------------------------------------------------------   07/19/22- 73 year old male former smoker followed for OSA/UPPP/CPAP, allergic rhinitis, Asthma, complicated by GERD/ Barrett's, DM2, HTN, RBBB, Hyperlipidemia,  -Proair hfa,  CPAP auto 10-20/ [employer] Download- compliance-100%, AHI 1.3/ hr Body weight today- 206 lbs Covid vax-4 Phizer Flu vax-had Download reviewed.  He reports doing very well.  Sleeps better with CPAP.  Also has a travel many machine. Minimal asthma.  Has a rescue inhaler he rarely needs-time for refill.  07/22/23- 73 year old male former smoker followed for OSA/UPPP/CPAP, allergic rhinitis, Asthma, complicated by GERD/ Barrett's, DM2, HTN, RBBB, Hyperlipidemia, Gout, -Proair hfa,  CPAP auto 10-20/ [employer] Download- compliance- Body weight today-     ROS-see HPI + = positive Constitutional:   No-   weight loss, night sweats, fevers, chills, fatigue, lassitude. HEENT:   No-  headaches, difficulty swallowing, tooth/dental problems, sore throat,       No-  sneezing, itching, ear ache, nasal congestion, post nasal drip,  CV:  No-   chest pain, orthopnea, PND, swelling in lower extremities, anasarca, dizziness, palpitations Resp: No-   shortness of breath with exertion or at rest.              No-   productive cough,  No non-productive cough,  No- coughing up of blood.              No-   change in color of mucus.  No- wheezing.   Skin: No-   rash or lesions. GI:  No-   heartburn, indigestion, abdominal pain, nausea, vomiting,  GU:  MS:  No acute  . Neuro-     nothing unusual Psych:  No- change in mood or affect. No depression or anxiety.  No memory loss.  OBJ- Physical Exam   General- Alert, Oriented, Affect-appropriate, Distress- none acute,  Skin-  rash-none, lesions- none, excoriation- none Lymphadenopathy- none Head- atraumatic            Eyes- Gross vision intact, PERRLA, conjunctivae and secretions clear            Ears- Hearing, canals-normal            Nose- Clear, no-Septal dev, mucus, polyps, erosion, perforation             Throat- Mallampati III- short uvula , mucosa clear , drainage- none, tonsils- atrophic Neck- flexible , trachea midline, no stridor , thyroid nl, carotid no bruit Chest - symmetrical excursion , unlabored           Heart/CV- RRR , no murmur , no gallop  , no rub, nl s1 s2                           - JVD- none , edema- none, stasis changes- none, varices- none           Lung- clear to P&A, wheeze- none, cough- none , dullness-none, rub- none           Chest wall-  Abd- Br/ Gen/ Rectal- Not done, not indicated Extrem- cyanosis- none, clubbing, none, atrophy- none, strength- nl Neuro- grossly intact to observation

## 2023-07-22 ENCOUNTER — Ambulatory Visit: Payer: HMO | Admitting: Internal Medicine

## 2023-07-22 ENCOUNTER — Encounter: Payer: Self-pay | Admitting: Internal Medicine

## 2023-07-22 VITALS — BP 146/81 | HR 69 | Temp 97.7°F | Resp 18 | Ht 71.0 in | Wt 206.8 lb

## 2023-07-22 DIAGNOSIS — G4733 Obstructive sleep apnea (adult) (pediatric): Secondary | ICD-10-CM | POA: Diagnosis not present

## 2023-07-22 DIAGNOSIS — J45909 Unspecified asthma, uncomplicated: Secondary | ICD-10-CM

## 2023-07-22 NOTE — Patient Instructions (Signed)
We can continue CPAP auto 10-20  Please call if we can help 

## 2023-10-07 ENCOUNTER — Other Ambulatory Visit: Payer: Self-pay | Admitting: Internal Medicine

## 2023-10-07 DIAGNOSIS — G4733 Obstructive sleep apnea (adult) (pediatric): Secondary | ICD-10-CM | POA: Diagnosis not present

## 2023-11-27 DIAGNOSIS — I10 Essential (primary) hypertension: Secondary | ICD-10-CM | POA: Diagnosis not present

## 2023-11-27 DIAGNOSIS — G4733 Obstructive sleep apnea (adult) (pediatric): Secondary | ICD-10-CM | POA: Diagnosis not present

## 2023-11-27 DIAGNOSIS — M109 Gout, unspecified: Secondary | ICD-10-CM | POA: Diagnosis not present

## 2023-11-27 DIAGNOSIS — E1136 Type 2 diabetes mellitus with diabetic cataract: Secondary | ICD-10-CM | POA: Diagnosis not present

## 2023-11-27 DIAGNOSIS — H35033 Hypertensive retinopathy, bilateral: Secondary | ICD-10-CM | POA: Diagnosis not present

## 2023-11-27 DIAGNOSIS — K2270 Barrett's esophagus without dysplasia: Secondary | ICD-10-CM | POA: Diagnosis not present

## 2024-01-05 DIAGNOSIS — G4733 Obstructive sleep apnea (adult) (pediatric): Secondary | ICD-10-CM | POA: Diagnosis not present

## 2024-02-05 DIAGNOSIS — E119 Type 2 diabetes mellitus without complications: Secondary | ICD-10-CM | POA: Diagnosis not present

## 2024-02-05 DIAGNOSIS — H2513 Age-related nuclear cataract, bilateral: Secondary | ICD-10-CM | POA: Diagnosis not present

## 2024-02-05 DIAGNOSIS — H524 Presbyopia: Secondary | ICD-10-CM | POA: Diagnosis not present

## 2024-02-05 DIAGNOSIS — H3562 Retinal hemorrhage, left eye: Secondary | ICD-10-CM | POA: Diagnosis not present

## 2024-07-22 ENCOUNTER — Encounter: Payer: Self-pay | Admitting: Primary Care
